# Patient Record
Sex: Female | Born: 2000 | Race: White | Hispanic: No | Marital: Single | Smoking: Former smoker
Health system: Southern US, Community
[De-identification: ages and names within clinical notes are randomized; demographics above are authoritative.]

## PROBLEM LIST (undated history)

## (undated) DIAGNOSIS — R519 Headache, unspecified: Secondary | ICD-10-CM

## (undated) DIAGNOSIS — J45909 Unspecified asthma, uncomplicated: Secondary | ICD-10-CM

## (undated) DIAGNOSIS — F509 Eating disorder, unspecified: Secondary | ICD-10-CM

## (undated) DIAGNOSIS — F32A Depression, unspecified: Secondary | ICD-10-CM

## (undated) DIAGNOSIS — F329 Major depressive disorder, single episode, unspecified: Secondary | ICD-10-CM

## (undated) DIAGNOSIS — K219 Gastro-esophageal reflux disease without esophagitis: Secondary | ICD-10-CM

## (undated) DIAGNOSIS — R51 Headache: Secondary | ICD-10-CM

## (undated) DIAGNOSIS — L309 Dermatitis, unspecified: Secondary | ICD-10-CM

## (undated) DIAGNOSIS — F419 Anxiety disorder, unspecified: Secondary | ICD-10-CM

## (undated) DIAGNOSIS — L709 Acne, unspecified: Secondary | ICD-10-CM

## (undated) HISTORY — DX: Eating disorder, unspecified: F50.9

## (undated) HISTORY — DX: Unspecified asthma, uncomplicated: J45.909

## (undated) HISTORY — PX: SEPTOPLASTY: SUR1290

## (undated) HISTORY — DX: Major depressive disorder, single episode, unspecified: F32.9

## (undated) HISTORY — PX: TONSILLECTOMY: SUR1361

## (undated) HISTORY — DX: Acne, unspecified: L70.9

## (undated) HISTORY — DX: Anxiety disorder, unspecified: F41.9

## (undated) HISTORY — DX: Depression, unspecified: F32.A

---
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
## 2019-01-11 ENCOUNTER — Ambulatory Visit
Admission: EM | Admit: 2019-01-11 | Discharge: 2019-01-11 | Disposition: A | Discharge status: Home / Self Care | Payer: Medicaid Other | Attending: Family Medicine | Admitting: Family Medicine

## 2019-01-11 ENCOUNTER — Encounter: Payer: Self-pay | Admitting: Emergency Medicine

## 2019-01-11 ENCOUNTER — Other Ambulatory Visit: Payer: Self-pay

## 2019-01-11 DIAGNOSIS — Z20828 Contact with and (suspected) exposure to other viral communicable diseases: Secondary | ICD-10-CM | POA: Diagnosis not present

## 2019-01-11 DIAGNOSIS — R197 Diarrhea, unspecified: Secondary | ICD-10-CM

## 2019-01-11 DIAGNOSIS — G44209 Tension-type headache, unspecified, not intractable: Secondary | ICD-10-CM

## 2019-01-11 DIAGNOSIS — Z7189 Other specified counseling: Secondary | ICD-10-CM

## 2019-01-11 DIAGNOSIS — J029 Acute pharyngitis, unspecified: Secondary | ICD-10-CM

## 2019-01-11 DIAGNOSIS — R519 Headache, unspecified: Secondary | ICD-10-CM | POA: Diagnosis present

## 2019-01-11 LAB — RAPID STREP SCREEN (MED CTR MEBANE ONLY): Streptococcus, Group A Screen (Direct): NEGATIVE

## 2019-01-11 MED ORDER — KETOROLAC TROMETHAMINE 60 MG/2ML IM SOLN
60.0000 mg | Freq: Once | INTRAMUSCULAR | Status: AC
  Administered 2019-01-11: 60 mg via INTRAMUSCULAR

## 2019-01-11 NOTE — Discharge Instructions (Signed)
Over-the-counter medication as needed.  Rest. Drink plenty of fluids.  Pending Covid testing.  Follow up with your primary care physician this week as needed. Return to Urgent care for new or worsening concerns.

## 2019-01-11 NOTE — ED Triage Notes (Signed)
Patient in today c/o migraine, diarrhea, sore throat and fatigue x 2 days. Patient has tried Ibuprofen without relief.

## 2019-01-11 NOTE — ED Provider Notes (Signed)
[facility] ____________________________________________  Time seen: Approximately 10:14 AM  I have reviewed the triage vital signs and the nursing notes.   HISTORY  Chief Complaint Migraine, Diarrhea, and Sore Throat   HPI Jessica Hebert is a 18 y.o. female presented for evaluation of 2 days of sore throat, headache and diarrhea.  States headache feels like a migraine and comes and goes.  Describes headache as frontal forehead headache aching went away yesterday and came back today.  No nausea or vomiting.  Has had episodes of diarrhea.  Denies abdominal pain.  Denies known sick contacts.  States sore throat is painful to swallow but has continued to drink fluids well, not eating much today.  Denies known fevers.  Has had some nasal congestion with decreased smell, continues with normal taste.  Has taken Tylenol ibuprofen, denies other alleviating measures.  Denies chest pain, shortness of breath or cough.  Reports otherwise doing well.  Patient's last menstrual period was 12/28/2018 (approximate).Denies pregnancy.    Past Medical History:  Diagnosis Date  . Acne   . Anxiety   . Asthma   . Depression   . Eating disorder, unspecified    Bulimia  . Eczema   . GERD (gastroesophageal reflux disease)   . Headache    sinus related    Patient Active Problem List   Diagnosis Date Noted  . Intractable migraine without aura and without status migrainosus 05/09/2018  . Acne 07/19/2016  . Asthma 07/19/2016  . Anxiety and depression 07/19/2016    Past Surgical History:  Procedure Laterality Date  . ENDOSCOPIC CONCHA BULLOSA RESECTION Left 02/24/2018   Procedure: ENDOSCOPIC CONCHA BULLOSA REDUCTION VIA NASAL ENDOSCOPY;  Surgeon: Margaretha Sheffield, MD;  Location: [place];  Service: ENT;  Laterality: Left;  . RHINOPLASTY  02/24/2018   septoplasty   . SEPTOPLASTY Bilateral 02/24/2018   Procedure: SEPTOPLASTY;  Surgeon: Margaretha Sheffield, MD;  Location: [place];  Service: ENT;  Laterality: Bilateral;  . TONSILLECTOMY AND ADENOIDECTOMY N/A 05/27/2017   Procedure: TONSILLECTOMY AND ADENOIDECTOMY;  Surgeon: Margaretha Sheffield, MD;  Location: [place];  Service: ENT;  Laterality: N/A;  . WISDOM TOOTH EXTRACTION  10/22/2017   four      Current Facility-Administered Medications:  .  ketorolac (TORADOL) injection 60 mg, 60 mg, Intramuscular, Once, Marylene Land, NP  Current Outpatient Medications:  .  albuterol (PROVENTIL HFA;VENTOLIN HFA) 108 (90 Base) MCG/ACT inhaler, Inhale 2 puffs into the lungs every 4 (four) hours as needed for wheezing or shortness of breath., Disp: 1 Inhaler, Rfl: 0 .  DULoxetine (CYMBALTA) 30 MG capsule, Take 3 capsules by mouth daily., Disp: , Rfl:  .  EPINEPHrine 0.3 mg/0.3 mL IJ SOAJ injection, U UTD, Disp: , Rfl:  .  etonogestrel-ethinyl estradiol (NUVARING) 0.12-0.015 MG/24HR vaginal ring, INSERT 1 RING IN THE VAGINA AND LEAVE IN PLACE FOR 3 CONSECUTIVE WEEKS, THEN REMOVE FOR 1 WEEK AS DIRECTED., Disp: 3 each, Rfl: 3 .  LORazepam (ATIVAN) 0.5 MG tablet, Take 1 tablet by mouth 2 (two) times daily., Disp: , Rfl:  .  Melatonin 10 MG TABS, Take by mouth., Disp: , Rfl:  .  montelukast (SINGULAIR) 10 MG tablet, Take 10 mg by mouth at bedtime., Disp: , Rfl:   Allergies Grapeseed extract [nutritional supplements], Other, and Pollen extract  Family History  Problem Relation Age of Onset  . Breast cancer Maternal Grandmother 57  . Hypertension Maternal Grandmother   . Heart disease Maternal Grandmother   . Stroke  Maternal Grandmother 81  . Depression Mother   . Lung cancer Maternal Uncle   . Drug abuse Father     Social History Social History   Tobacco Use  . Smoking status: Never Smoker  . Smokeless tobacco: Never Used  Substance Use Topics  . Alcohol use: Yes    Comment: social  . Drug use: No    Review of Systems Constitutional: No fever Eyes: No visual changes. ENT: Positive sore throat.  Cardiovascular: Denies chest pain. Respiratory: Denies shortness of breath. Gastrointestinal: No abdominal pain.   Genitourinary: Negative for dysuria. Musculoskeletal: Negative for back pain. Skin: Negative for rash. Neurological: Negative for focal weakness or numbness.  Positive headache.    ____________________________________________   PHYSICAL EXAM:  VITAL SIGNS: ED Triage Vitals  Enc Vitals Group     BP 01/11/19 0937 132/87     Pulse Rate 01/11/19 0937 86     Resp 01/11/19 0937 18     Temp 01/11/19 0937 98.2 F (36.8 C)     Temp Source 01/11/19 0937 Oral     SpO2 01/11/19 0937 100 %     Weight 01/11/19 0940 220 lb (99.8 kg)     Height 01/11/19 0940 5' 9.5" (1.765 m)     Head Circumference --      Peak Flow --      Pain Score 01/11/19 0940 10     Pain Loc --      Pain Edu? --      Excl. in GC? --     Constitutional: Alert and oriented. Well appearing and in no acute distress. Eyes: Conjunctivae are normal. PERRL. EOMI. Head: Atraumatic. No sinus tenderness to palpation. No swelling. No erythema.  Ears: no erythema, normal TMs bilaterally.   Nose:Nasal congestion   Mouth/Throat: Mucous membranes are moist. Mild pharyngeal erythema. No tonsillar swelling or exudate.  Neck: No stridor.  No cervical spine tenderness to palpation. Hematological/Lymphatic/Immunilogical: No cervical lymphadenopathy. Cardiovascular: Normal rate, regular rhythm. Grossly normal heart sounds.  Good peripheral circulation. Respiratory: Normal respiratory effort.  No retractions. No wheezes, rales or rhonchi. Good air movement.  Gastrointestinal:  Normal Bowel sounds.  Mild generalized lower abdominal tenderness, no point tenderness.  No guarding.  No CVA tenderness. Musculoskeletal: Ambulatory with steady gait. No cervical, thoracic or lumbar tenderness to palpation. Neurologic:  Normal speech and language. No gait instability.  No meningismus. Skin:  Skin appears warm, dry and intact. No  rash noted. Psychiatric: Mood and affect are normal. Speech and behavior are normal.  ___________________________________________   LABS (all labs ordered are listed, but only abnormal results are displayed)  Labs Reviewed  RAPID STREP SCREEN ([facility] ONLY)  NOVEL CORONAVIRUS, NAA (HOSP ORDER, SEND-OUT TO REF LAB; TAT 18-24 HRS)  CULTURE, GROUP A STREP [facility])    PROCEDURES Procedures    INITIAL IMPRESSION / ASSESSMENT AND PLAN / ED COURSE  Pertinent labs & imaging results that were available during my care of the patient were reviewed by me and considered in my medical decision making (see chart for details).  Well-appearing patient.  No acute distress.  Suspect viral illness.  Strep negative, will culture.  60 mg IM Toradol given once in urgent care.  Encourage rest, fluids, supportive care.  COVID-19 testing completed advice given.  Stay home unless seeking further care pending test results.Discussed indication, risks and benefits of medications with patient.  Discussed follow up with Primary care physician this week. Discussed follow up and return parameters including no  resolution or any worsening concerns. Patient verbalized understanding and agreed to plan.   ____________________________________________   FINAL CLINICAL IMPRESSION(S) / ED DIAGNOSES  Final diagnoses:  Sore throat  Acute nonintractable headache, unspecified headache type  Diarrhea, unspecified type  Advice given about COVID-19 virus infection     ED Discharge Orders    None       Note: This dictation was prepared with Dragon dictation along with smaller phrase technology. Any transcriptional errors that result from this process are unintentional.         Renford DillsMiller, Tannis Burstein, NP 01/11/19 1019

## 2019-01-12 LAB — NOVEL CORONAVIRUS, NAA (HOSP ORDER, SEND-OUT TO REF LAB; TAT 18-24 HRS): SARS-CoV-2, NAA: NOT DETECTED

## 2019-01-13 LAB — CULTURE, GROUP A STREP (THRC)

## 2019-05-16 ENCOUNTER — Ambulatory Visit
Admission: EM | Admit: 2019-05-16 | Discharge: 2019-05-16 | Disposition: A | Discharge status: Home / Self Care | Payer: BC Managed Care – PPO | Attending: Urgent Care | Admitting: Urgent Care

## 2019-05-16 ENCOUNTER — Other Ambulatory Visit: Payer: Self-pay

## 2019-05-16 DIAGNOSIS — Z7189 Other specified counseling: Secondary | ICD-10-CM | POA: Diagnosis present

## 2019-05-16 DIAGNOSIS — J324 Chronic pansinusitis: Secondary | ICD-10-CM | POA: Diagnosis present

## 2019-05-16 DIAGNOSIS — Z20822 Contact with and (suspected) exposure to covid-19: Secondary | ICD-10-CM | POA: Insufficient documentation

## 2019-05-16 LAB — SARS CORONAVIRUS 2 (TAT 6-24 HRS): SARS Coronavirus 2: NEGATIVE

## 2019-05-16 MED ORDER — PREDNISONE 20 MG PO TABS: 40.0000 mg | ORAL_TABLET | Freq: Every day | ORAL | 0 refills | Status: DC

## 2019-05-16 MED ORDER — AMOXICILLIN-POT CLAVULANATE 875-125 MG PO TABS: 1.0000 | ORAL_TABLET | Freq: Two times a day (BID) | ORAL | 0 refills | Status: AC

## 2019-05-16 NOTE — ED Provider Notes (Signed)
[place], [place]   Name: Jessica Hebert DOB: 02/06/2001 MRN: [id number] CSN: [id number] PCP: Sallee Lange, NP  Arrival date and time:  05/16/19 [phone]  Chief Complaint:  Sinus Problem   NOTE: Prior to seeing the patient today, I have reviewed the triage nursing documentation and vital signs. Clinical staff has updated patient's PMH/PSHx, current medication list, and drug allergies/intolerances to ensure comprehensive history available to assist in medical decision making.   History:   HPI: Jessica Hebert is a 19 y.o. female who presents today with complaints of congestion, nasal bleeding, headaches, PND, and both paranasal and frontal sinus tenderness that started approximately 2 weeks ago. Patient denies fevers.. She denies any cough, shortness of breath, or wheezing. Patient has PMH (+) for significant sinus issues. She has had a septoplasty in the past. Patient also with history of asthma and patient feels like she is having a flare. Patient with nausea yesterday. She denies that she has experienced any vomiting, diarrhea, or abdominal pain. She is eating and drinking well. Patient denies any perceived alterations to her sense of taste or smell. Patient was seen by her PCP last week and started on loratadine and montelukast, which she notes have not improved her symptoms. Patient states, "I am getting worse. I just don;t feel well at all". Patient presents out of concerns for her personal health after being exposed to someone several co-workers who have been ill. Patient reporting that 6 people called out of work on Saturday. She is unsure if any of them have tested positive for SARS-CoV-2 (novel coronavirus). She has not been tested for SARS-CoV-2 (novel coronavirus) in the past 14 days; last tested negative several months ago per her report. No one else is her home has experienced a similar symptom constellation. She has never been tested for SARS-CoV-2 (novel coronavirus) in the past per her  report. Patient has not been vaccinated for influenza this season. Despite her symptoms, patient has not taken any over the counter interventions to help improve/relieve her reported symptoms at home.   Past Medical History:  Diagnosis Date  . Acne   . Anxiety   . Asthma   . Depression   . Eating disorder, unspecified    Bulimia  . Eczema   . GERD (gastroesophageal reflux disease)   . Headache    sinus related    Past Surgical History:  Procedure Laterality Date  . ENDOSCOPIC CONCHA BULLOSA RESECTION Left 02/24/2018   Procedure: ENDOSCOPIC CONCHA BULLOSA REDUCTION VIA NASAL ENDOSCOPY;  Surgeon: Margaretha Sheffield, MD;  Location: [facility];  Service: ENT;  Laterality: Left;  . RHINOPLASTY  02/24/2018   septoplasty   . SEPTOPLASTY Bilateral 02/24/2018   Procedure: SEPTOPLASTY;  Surgeon: Margaretha Sheffield, MD;  Location: [facility];  Service: ENT;  Laterality: Bilateral;  . TONSILLECTOMY AND ADENOIDECTOMY N/A 05/27/2017   Procedure: TONSILLECTOMY AND ADENOIDECTOMY;  Surgeon: Margaretha Sheffield, MD;  Location: [facility];  Service: ENT;  Laterality: N/A;  . WISDOM TOOTH EXTRACTION  10/22/2017   four    Family History  Problem Relation Age of Onset  . Breast cancer Maternal Grandmother 54  . Hypertension Maternal Grandmother   . Heart disease Maternal Grandmother   . Stroke Maternal Grandmother 27  . Depression Mother   . Lung cancer Maternal Uncle   . Drug abuse Father     Social History   Tobacco Use  . Smoking status: Never Smoker  . Smokeless tobacco: Never Used  Substance Use Topics  .  Alcohol use: Yes    Comment: social  . Drug use: No    Patient Active Problem List   Diagnosis Date Noted  . Intractable migraine without aura and without status migrainosus 05/09/2018  . Acne 07/19/2016  . Asthma 07/19/2016  . Anxiety and depression 07/19/2016    Home Medications:    Current Meds  Medication Sig  . albuterol (PROVENTIL HFA;VENTOLIN HFA) 108  (90 Base) MCG/ACT inhaler Inhale 2 puffs into the lungs every 4 (four) hours as needed for wheezing or shortness of breath.  Marland Kitchen amitriptyline (ELAVIL) 10 MG tablet Take 1 tablet by mouth at bedtime.  . DULoxetine (CYMBALTA) 30 MG capsule Take 3 capsules by mouth daily.  Marland Kitchen EPINEPHrine 0.3 mg/0.3 mL IJ SOAJ injection U UTD  . etonogestrel-ethinyl estradiol (NUVARING) 0.12-0.015 MG/24HR vaginal ring INSERT 1 RING IN THE VAGINA AND LEAVE IN PLACE FOR 3 CONSECUTIVE WEEKS, THEN REMOVE FOR 1 WEEK AS DIRECTED.  Marland Kitchen loratadine (CLARITIN) 10 MG tablet Take 10 mg by mouth daily.  Marland Kitchen LORazepam (ATIVAN) 0.5 MG tablet Take 1 tablet by mouth 2 (two) times daily.  . Melatonin 10 MG TABS Take by mouth.  . montelukast (SINGULAIR) 10 MG tablet Take 10 mg by mouth at bedtime.    Allergies:   Grapeseed extract [nutritional supplements], Other, and Pollen extract  Review of Systems (ROS):  Review of systems NEGATIVE unless otherwise noted in narrative H&P section.   Vital Signs: Today's Vitals   05/16/19 1009 05/16/19 1012 05/16/19 1039  BP:  (!) 146/115   Pulse:  91   Resp:  18   Temp:  97.9 F (36.6 C)   TempSrc:  Oral   SpO2:  100%   Weight: 220 lb (99.8 kg)    Height: 5' 9.5" (1.765 m)    PainSc: 0-No pain  0-No pain    Physical Exam: Physical Exam  Constitutional: She is oriented to person, place, and time and well-developed, well-nourished, and in no distress.  HENT:  Head: Normocephalic and atraumatic.  Right Ear: Tympanic membrane normal.  Left Ear: There is tenderness. Tympanic membrane is erythematous. Tympanic membrane is not bulging. A middle ear effusion (mild suppurative) is present.  Nose: Mucosal edema, rhinorrhea and sinus tenderness present.  Mouth/Throat: Uvula is midline. Posterior oropharyngeal erythema (moderate with (+) purulent PND) present. No oropharyngeal exudate or posterior oropharyngeal edema.  Eyes: Pupils are equal, round, and reactive to light.  Cardiovascular:  Normal rate, regular rhythm, normal heart sounds and intact distal pulses.  Pulmonary/Chest: Effort normal. She has wheezes (scattered expiratory).  No cough noted in clinic. No SOB or increased WOB. No distress. Able to speak in complete sentences without difficulties. SPO2 100% on RA.  Lymphadenopathy:       Head (left side): Submandibular adenopathy present.  Neurological: She is alert and oriented to person, place, and time. Gait normal.  Skin: Skin is warm and dry. No rash noted. She is not diaphoretic.  Psychiatric: Mood, memory, affect and judgment normal.  Nursing note and vitals reviewed.   Urgent Care Treatments / Results:   Orders Placed This Encounter  Procedures  . SARS CORONAVIRUS 2 (TAT 6-24 HRS) Nasopharyngeal Nasopharyngeal Swab    LABS: PLEASE NOTE: all labs that were ordered this encounter are listed, however only abnormal results are displayed. Labs Reviewed  SARS CORONAVIRUS 2 (TAT 6-24 HRS)    EKG: -None  RADIOLOGY: No results found.  PROCEDURES: Procedures  MEDICATIONS RECEIVED THIS VISIT: Medications - No data to display  PERTINENT CLINICAL COURSE NOTES/UPDATES:   Initial Impression / Assessment and Plan / Urgent Care Course:  Pertinent labs & imaging results that were available during my care of the patient were personally reviewed by me and considered in my medical decision making (see lab/imaging section of note for values and interpretations).  KAMILYA WAKEMAN is a 19 y.o. female who presents to [facility] today with complaints of Sinus Problem  Patient overall well appearing and in no acute distress today in clinic. Presenting symptoms (see HPI) and exam as documented above. She presents with symptoms associated with SARS-CoV-2 (novel coronavirus) following close exposure to several co-workers who are ill with similar symptoms.  Discussed typical symptom constellation. Reviewed potential for infection and need for testing. Patient  amenable to being tested. SARS-CoV-2 swab collected by certified clinical staff. Discussed variable turn around times associated with testing, as swabs are being processed at the main campus of [facility] in [place], and have been taking 12-24 hours to come back. She was advised to self quarantine, per [facility] DHHS guidelines, until negative results received. These measures are being implemented out of an abundance of caution to prevent transmission and spread during the current SARS-CoV-2 pandemic.  Presenting symptoms consistent with acute sinusitis and flare of patient's known asthma. She has been treated for allergies and continues to worsen. Will proceed with treatment using a 10 day course of amoxicillin-clavulanate,and a 5 day steroid (prednisone) burst. Patient to continue SABA (albuterol) MDI. Discussed that, until ruled out with confirmatory lab testing, SARS-CoV-2 remains part of the differential. Her testing is pending at this time. Discussed supportive care measures at home during acute phase of illness. Patient to rest as much as possible. She was encouraged to ensure adequate hydration (water and ORS) to prevent dehydration and electrolyte derangements. Recommended warm salt water gargles, hard candies/lozenges, and hot tea with honey/lemon to help soothe the throat and reduce irritation. May use Tylenol and/or Ibuprofen as needed for pain/fever.    Current clinical condition warrants patient being out of work in order to quarantine while waiting for testing results. She was provided with the appropriate documentation to provide to her place of employment that will allow for her to RTW on 05/19/2019 with no restrictions. RTW is contingent on her SARS-CoV-2 test results being reviewed as negative.     Discussed follow up with primary care physician in 1 week for re-evaluation. I have reviewed the follow up and strict return precautions for any new or worsening symptoms. Patient is aware of  symptoms that would be deemed urgent/emergent, and would thus require further evaluation either here or in the emergency department. At the time of discharge, she verbalized understanding and consent with the discharge plan as it was reviewed with her. All questions were fielded by provider and/or clinic staff prior to patient discharge.    Final Clinical Impressions / Urgent Care Diagnoses:   Final diagnoses:  Pansinusitis, unspecified chronicity  Encounter for laboratory testing for COVID-19 virus  Advice given about COVID-19 virus infection    New Prescriptions:  [place] Controlled Substance Registry consulted? Not Applicable  Meds ordered this encounter  Medications  . amoxicillin-clavulanate (AUGMENTIN) 875-125 MG tablet    Sig: Take 1 tablet by mouth 2 (two) times daily for 10 days.    Dispense:  20 tablet    Refill:  0  . predniSONE (DELTASONE) 20 MG tablet    Sig: Take 2 tablets (40 mg total) by mouth daily.    Dispense:  10 tablet    Refill:  0    Recommended Follow up Care:  Patient encouraged to follow up with the following provider within the specified time frame, or sooner as dictated by the severity of her symptoms. As always, she was instructed that for any urgent/emergent care needs, she should seek care either here or in the emergency department for more immediate evaluation.  Follow-up Information    Gauger, Hermenia Fiscal, NP In 1 week.   Specialty: Internal Medicine Why: General reassessment of symptoms if not improving Contact information: [address] [place] [address] [phone]         NOTE: This note was prepared using Dragon dictation software along with smaller phrase technology. Despite my best ability to proofread, there is the potential that transcriptional errors may still occur from this process, and are completely unintentional.    Verlee Monte, NP 05/16/19 1153

## 2019-05-16 NOTE — Discharge Instructions (Signed)
It was very nice seeing you today in clinic. Thank you for entrusting me with your care.   Rest and Stay HYDRATED. Water and electrolyte containing beverages (Gatorade, Pedialyte) are best to prevent dehydration and electrolyte abnormalities. Please utilize the medications that we discussed. Your prescriptions has been called in to your pharmacy. May use Tylenol and/or Ibuprofen as needed for pain/fever.   You were tested for SARS-CoV-2 (novel coronavirus) today. Testing is being processed at the main campus of [facility] in [place], and have been taking 12-24 hours to come back. Current recommendations from the the CDC and [place] DHHS require that you remain out of work in order to quarantine at home until negative test results are have been received. In the event that your test results are positive, you will be contacted with further directives. These measures are being implemented out of an abundance of caution to prevent transmission and spread during the current SARS-CoV-2 pandemic.   Make arrangements to follow up with your regular doctor in 1 week for re-evaluation if not improving. If your symptoms/condition worsens, please seek follow up care either here or in the ER. Please remember, our [facility] providers are "right here with you" when you need us.   Again, it was my pleasure to take care of you today. Thank you for choosing our clinic. I hope that you start to feel better quickly.   Babe Clenney, MSN, APRN, FNP-C, CEN Advanced Practice Provider  [facility] 

## 2019-05-16 NOTE — ED Triage Notes (Signed)
Pt presents with c/o sinus congestion and nose bleeds with pnd for several weeks now. Pt also reports her asthma has been flaring due to this. Pt has not taken any OTC medications for symptoms. Pt denies fever/chills, sore throat, ear pain or other symptoms. Pt states her employer is requesting COVID testing.

## 2019-05-25 IMAGING — CR DG HAND COMPLETE 3+V*L*
3 series · 3 of 3 positions shown · non-contrast
Comparison: None.

CLINICAL DATA: Per patient left hand got slammed in her car door x
today. Pt unable to straighten out hand completely for x-ray. Pt
states pinky and half her hand got slammed in door by wind.

EXAM:
LEFT HAND - COMPLETE 3+ VIEW

[hand ap]
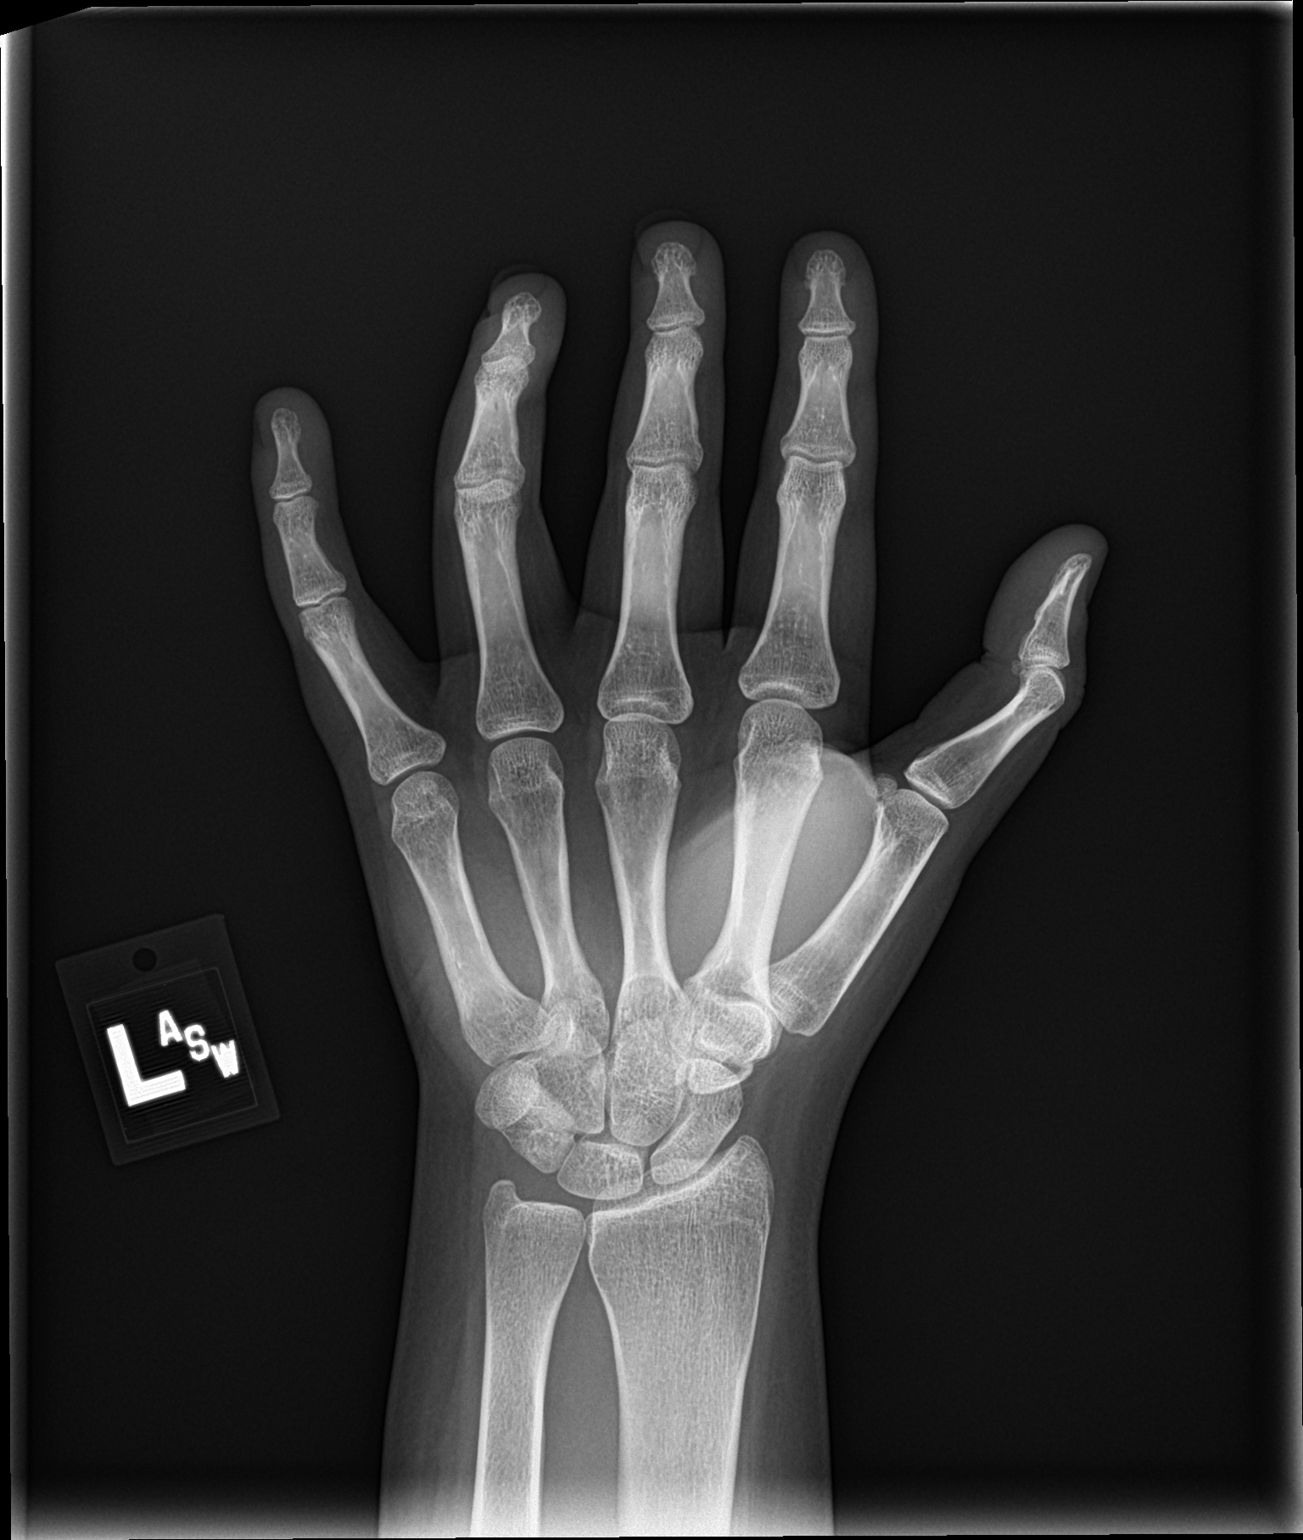

[hand obl]
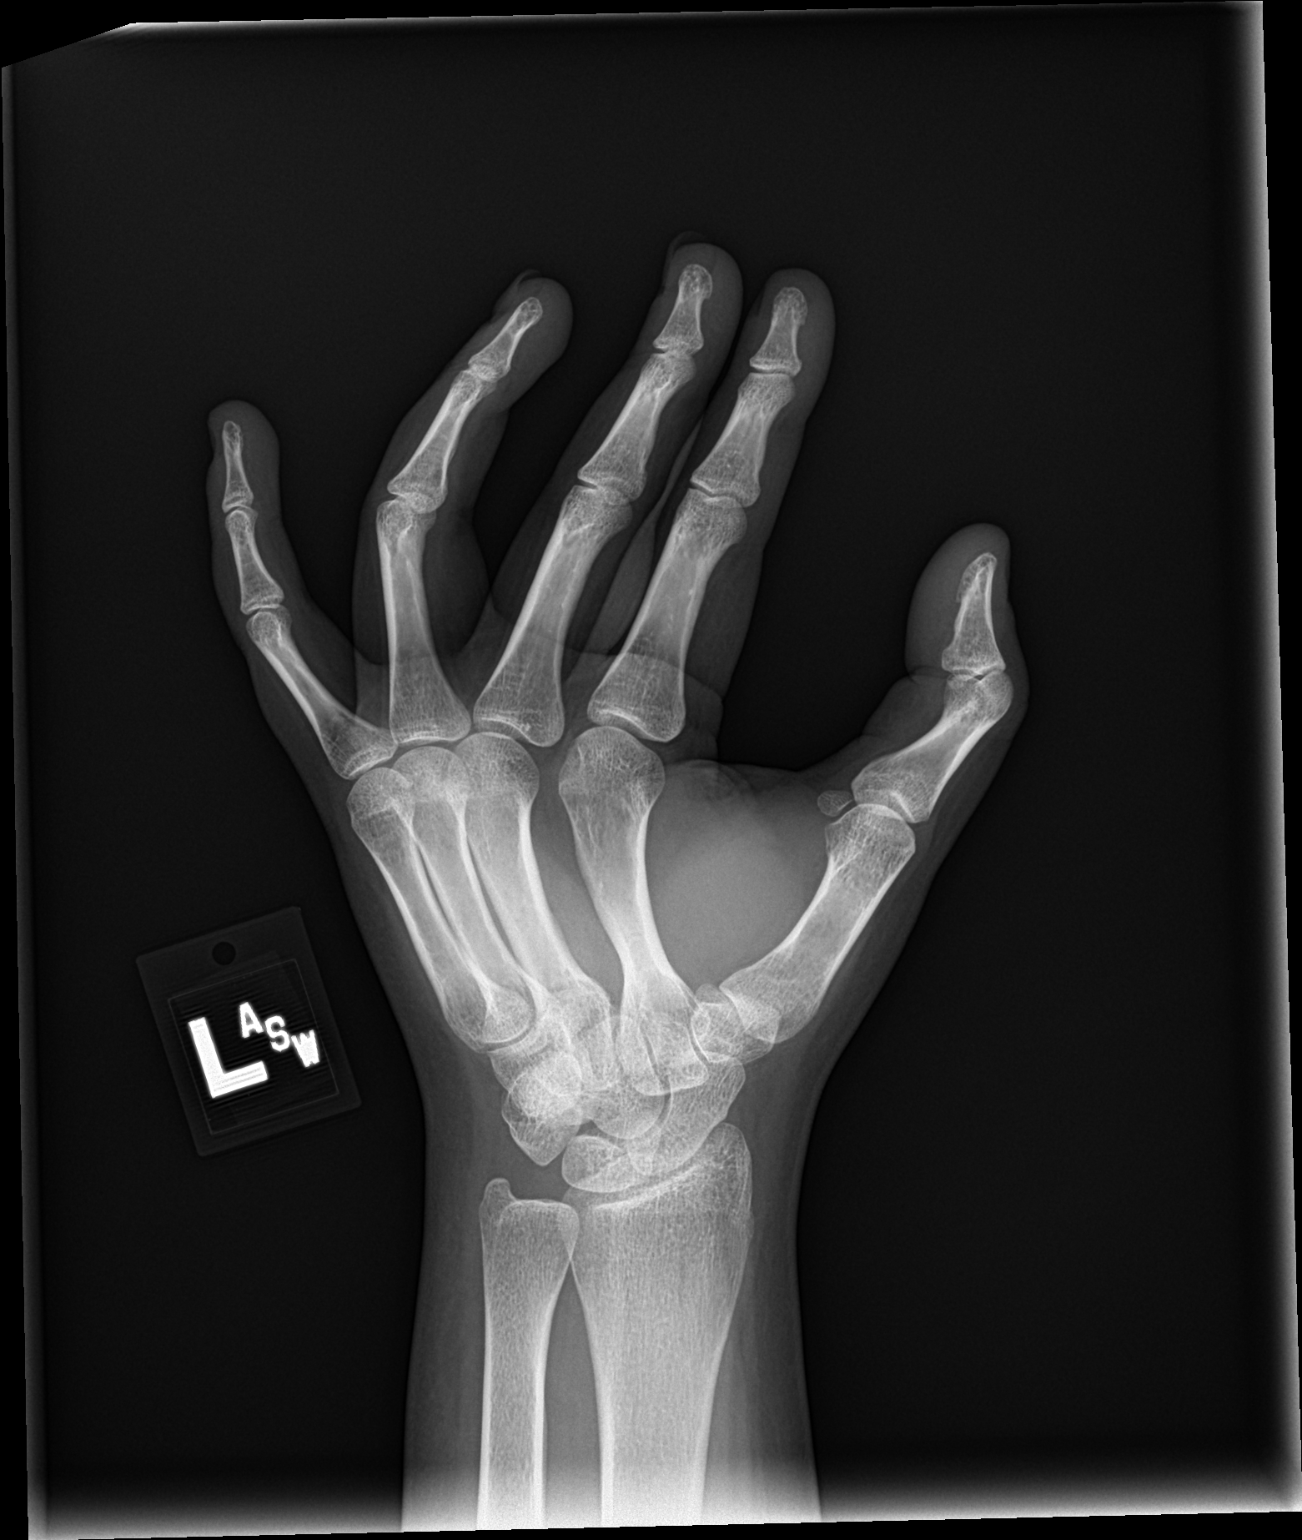

[hand lat]
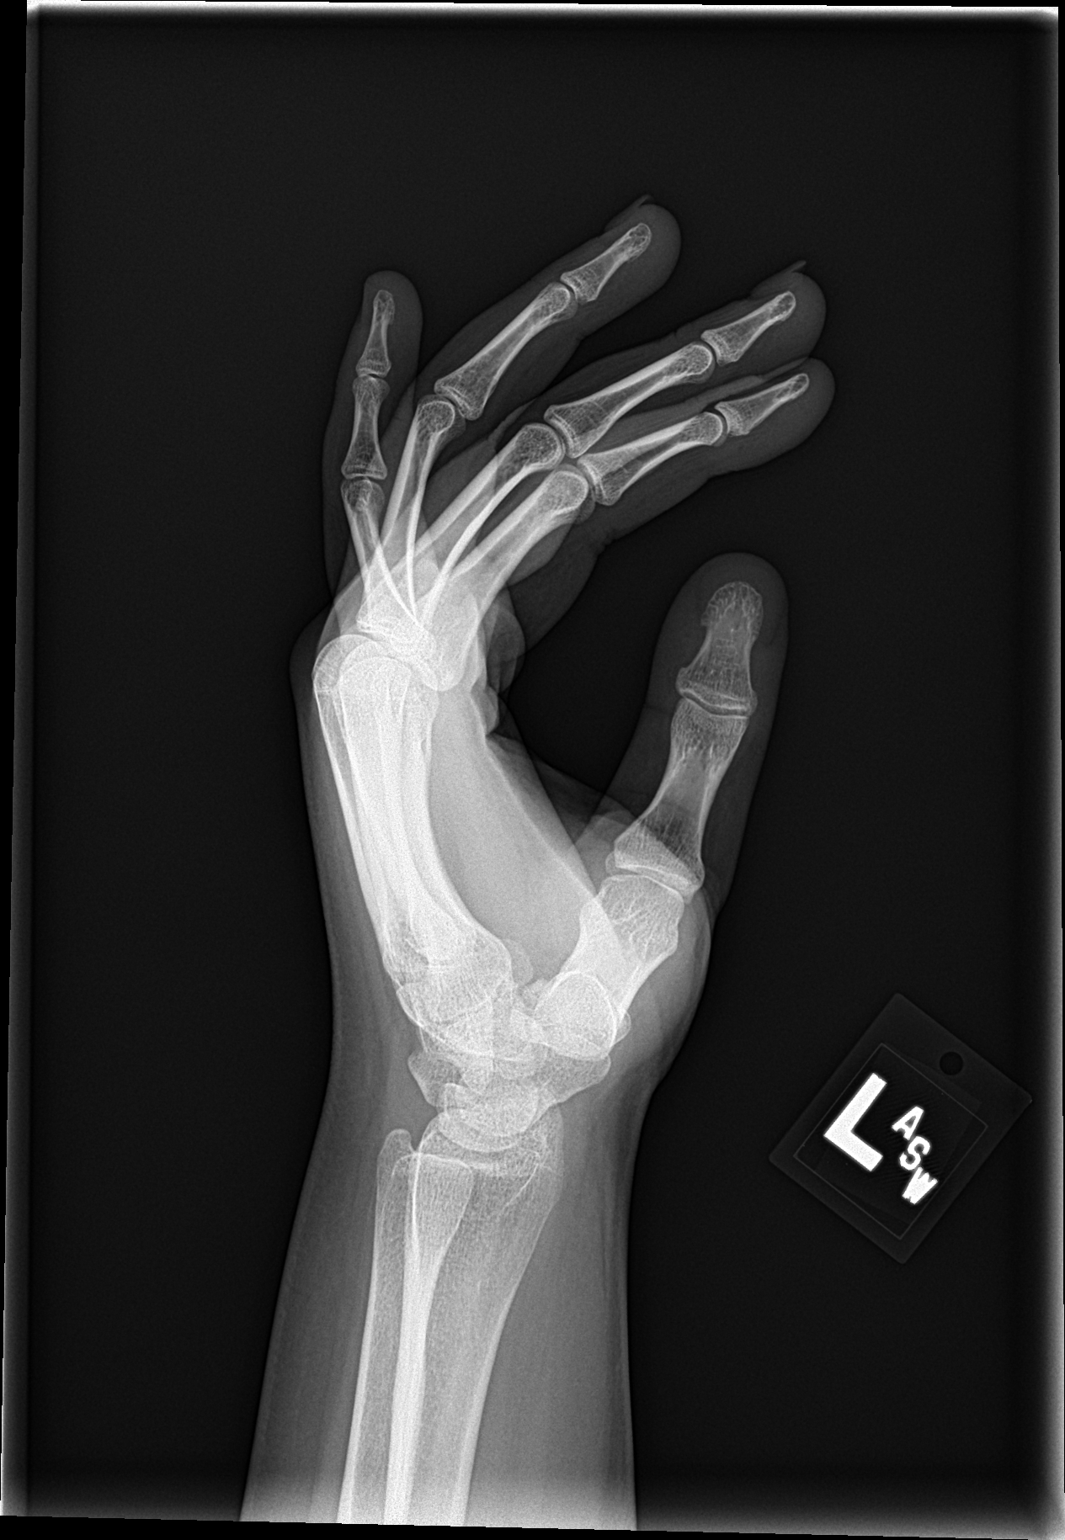

[3 of 3 positions shown; findings below may reference images not displayed]

FINDINGS: There is no evidence of fracture or dislocation. There is no
evidence of arthropathy or other focal bone abnormality. Soft
tissues are unremarkable.
IMPRESSION: Negative.

## 2019-07-24 ENCOUNTER — Other Ambulatory Visit: Payer: Self-pay

## 2019-07-24 ENCOUNTER — Ambulatory Visit: Payer: Self-pay

## 2019-07-24 DIAGNOSIS — E663 Overweight: Secondary | ICD-10-CM | POA: Insufficient documentation

## 2019-07-24 DIAGNOSIS — F909 Attention-deficit hyperactivity disorder, unspecified type: Secondary | ICD-10-CM | POA: Insufficient documentation

## 2019-07-24 DIAGNOSIS — J309 Allergic rhinitis, unspecified: Secondary | ICD-10-CM | POA: Insufficient documentation

## 2019-07-24 DIAGNOSIS — Z0283 Encounter for blood-alcohol and blood-drug test: Secondary | ICD-10-CM

## 2019-07-24 NOTE — Progress Notes (Signed)
Presents for pre-employment drug screen. Specimen collected using [employer] of Custody form for [facility] account number [id number]. Specimen ID [id number]  AMD

## 2020-03-16 ENCOUNTER — Ambulatory Visit: Admit: 2020-03-16 | Discharge: 2020-03-16 | Discharge status: Against Medical Advice | Payer: Medicaid Other

## 2020-03-16 ENCOUNTER — Other Ambulatory Visit: Payer: Self-pay

## 2020-04-30 ENCOUNTER — Other Ambulatory Visit: Payer: Self-pay

## 2020-04-30 ENCOUNTER — Ambulatory Visit
Admission: EM | Admit: 2020-04-30 | Discharge: 2020-04-30 | Disposition: A | Discharge status: Home / Self Care | Payer: Medicaid Other | Attending: Emergency Medicine | Admitting: Emergency Medicine

## 2020-04-30 DIAGNOSIS — G43009 Migraine without aura, not intractable, without status migrainosus: Secondary | ICD-10-CM

## 2020-04-30 DIAGNOSIS — J324 Chronic pansinusitis: Secondary | ICD-10-CM

## 2020-04-30 MED ORDER — FLUTICASONE PROPIONATE 50 MCG/ACT NA SUSP: 2.0000 | Freq: Every day | NASAL | 0 refills | Status: DC

## 2020-04-30 MED ORDER — ONDANSETRON 8 MG PO TBDP
8.0000 mg | ORAL_TABLET | Freq: Once | ORAL | Status: AC
  Administered 2020-04-30: 8 mg via ORAL

## 2020-04-30 MED ORDER — KETOROLAC TROMETHAMINE 60 MG/2ML IM SOLN
30.0000 mg | Freq: Once | INTRAMUSCULAR | Status: AC
  Administered 2020-04-30: 30 mg via INTRAMUSCULAR

## 2020-04-30 MED ORDER — ACETAMINOPHEN 500 MG PO TABS
1000.0000 mg | ORAL_TABLET | Freq: Once | ORAL | Status: AC
  Administered 2020-04-30: 1000 mg via ORAL

## 2020-04-30 MED ORDER — IBUPROFEN 600 MG PO TABS: 600.0000 mg | ORAL_TABLET | Freq: Four times a day (QID) | ORAL | 0 refills | Status: DC | PRN

## 2020-04-30 MED ORDER — AMOXICILLIN-POT CLAVULANATE 875-125 MG PO TABS: 1.0000 | ORAL_TABLET | Freq: Two times a day (BID) | ORAL | 0 refills | Status: AC

## 2020-04-30 MED ORDER — DEXAMETHASONE SODIUM PHOSPHATE 10 MG/ML IJ SOLN
10.0000 mg | Freq: Once | INTRAMUSCULAR | Status: AC
  Administered 2020-04-30: 10 mg via INTRAMUSCULAR

## 2020-04-30 NOTE — ED Provider Notes (Signed)
HPI  SUBJECTIVE:  Jessica Hebert is a 20 y.o. female who reports a acute onset of frontal headache located above her eyes starting this morning. She reports nausea, one episode of emesis. Reports green rhinorrhea, nasal congestion, sinus pain and pressure worse on the right than the left. She has had similar headaches every other day over the past 2 months, but states that today is different in that it is more severe and is accompanied by nausea, vomiting. She reports phonophobia, photophobia. It has not changed since it started. It was not worst at its onset. No syncope, it did not occur with exertion. No fevers, blurry vision, double vision, visual loss, ear pain, jaw pain, dental pain, ndysarthria, focal weakness, facial droop, discoordination. No body aches, neck stiffness, rash. No mental status changes, seizures, syncope. Pt is not pregnant.  Past medical history of frequent sinusitis, status post septoplasty and tonsillectomy, COVID, hypertension. Negative for aneurysm, SAH/ICH, HIV,   stroke, atrial fibrillation or temporal arteritis. Pt is not on any antiplatelets/anticoagulants. LMP: 1 month ago. Denies possibility being pregnant. PMD: [facility]   Past Medical History:  Diagnosis Date  . Acne   . Anxiety   . Asthma   . Depression   . Eating disorder, unspecified    Bulimia  . Eczema   . GERD (gastroesophageal reflux disease)   . Headache    sinus related    Past Surgical History:  Procedure Laterality Date  . ENDOSCOPIC CONCHA BULLOSA RESECTION Left 02/24/2018   Procedure: ENDOSCOPIC CONCHA BULLOSA REDUCTION VIA NASAL ENDOSCOPY;  Surgeon: Vernie Murders, MD;  [facility];  Service: ENT;  Laterality: Left;  . RHINOPLASTY  02/24/2018   septoplasty   . SEPTOPLASTY Bilateral 02/24/2018   Procedure: SEPTOPLASTY;  Surgeon: Vernie Murders, MD;  Location: [facility];  Service: ENT;  Laterality: Bilateral;  . TONSILLECTOMY AND ADENOIDECTOMY N/A  05/27/2017   Procedure: TONSILLECTOMY AND ADENOIDECTOMY;  Surgeon: Vernie Murders, MD;  Location: [facility];  Service: ENT;  Laterality: N/A;  . WISDOM TOOTH EXTRACTION  10/22/2017   four    Family History  Problem Relation Age of Onset  . Breast cancer Maternal Grandmother 58  . Hypertension Maternal Grandmother   . Heart disease Maternal Grandmother   . Stroke Maternal Grandmother 66  . Depression Mother   . Lung cancer Maternal Uncle   . Drug abuse Father     Social History   Tobacco Use  . Smoking status: Never Smoker  . Smokeless tobacco: Never Used  Vaping Use  . Vaping Use: Never used  Substance Use Topics  . Alcohol use: Yes    Comment: social  . Drug use: No    No current facility-administered medications for this encounter.  Current Outpatient Medications:  .  albuterol (PROVENTIL HFA;VENTOLIN HFA) 108 (90 Base) MCG/ACT inhaler, Inhale 2 puffs into the lungs every 4 (four) hours as needed for wheezing or shortness of breath., Disp: 1 Inhaler, Rfl: 0 .  amoxicillin-clavulanate (AUGMENTIN) 875-125 MG tablet, Take 1 tablet by mouth 2 (two) times daily for 10 days., Disp: 20 tablet, Rfl: 0 .  EPINEPHrine 0.3 mg/0.3 mL IJ SOAJ injection, U UTD, Disp: , Rfl:  .  fluticasone (FLONASE) 50 MCG/ACT nasal spray, Place 2 sprays into both nostrils daily., Disp: 16 g, Rfl: 0 .  ibuprofen (ADVIL) 600 MG tablet, Take 1 tablet (600 mg total) by mouth every 6 (six) hours as needed., Disp: 30 tablet, Rfl: 0 .  loratadine (CLARITIN)  10 MG tablet, Take 10 mg by mouth daily., Disp: , Rfl:  .  Melatonin 10 MG TABS, Take by mouth., Disp: , Rfl:  .  montelukast (SINGULAIR) 10 MG tablet, Take 10 mg by mouth at bedtime., Disp: , Rfl:  .  amitriptyline (ELAVIL) 10 MG tablet, Take 1 tablet by mouth at bedtime., Disp: , Rfl:  .  DULoxetine (CYMBALTA) 30 MG capsule, Take 3 capsules by mouth daily., Disp: , Rfl:  .  etonogestrel-ethinyl estradiol (NUVARING) 0.12-0.015 MG/24HR vaginal  ring, INSERT 1 RING IN THE VAGINA AND LEAVE IN PLACE FOR 3 CONSECUTIVE WEEKS, THEN REMOVE FOR 1 WEEK AS DIRECTED., Disp: 3 each, Rfl: 3 .  levonorgestrel-ethinyl estradiol (SEASONALE) 0.15-0.03 MG tablet, Take 1 tablet by mouth daily., Disp: , Rfl:  .  LORazepam (ATIVAN) 0.5 MG tablet, Take 1 tablet by mouth 2 (two) times daily., Disp: , Rfl:  .  venlafaxine XR (EFFEXOR-XR) 75 MG 24 hr capsule, Take 225 mg by mouth daily., Disp: , Rfl:   Allergies  Allergen Reactions  . Grapeseed Extract [Nutritional Supplements] Shortness Of Breath    Grapes cause wheezing  . Other     Cats- swelling eyes; sneezing  . Pollen Extract Other (See Comments)    Hickory tree - sneezing     ROS  As noted in HPI.   Physical Exam  BP (!) 145/86 (BP Location: Right Arm)   Pulse 92   Temp 98.2 F (36.8 C) (Oral)   Resp 18   Ht 5' 9.5" (1.765 m)   Wt 104.3 kg   SpO2 100%   BMI 33.48 kg/m   Constitutional: Well developed, well nourished, no acute distress Eyes: PERRL, EOMI, conjunctiva normal bilaterally.  No obvious photophobia. HENT: Normocephalic, atraumatic,mucus membranes moist.  TM normal b/l. No TMJ tenderness.  Positive purulent bilateral nasal congestion, positive right-sided frontal and maxillary sinus tenderness.  Positive left frontal sinus tenderness.  No temporal artery tenderness.  Neck: no cervical LN + bilateral trapezial muscle tenderness. No meningismus Respiratory: normal inspiratory effort Cardiovascular: Normal rate, regular rhythm GI:  nondistended skin: No rash, skin intact Musculoskeletal: No edema, no tenderness, no deformities Neurologic: Alert & oriented x 3, CN III-XII intact, romberg neg, finger-> nose, heel-> shin equal b/l, Romberg neg, tandem gait steady Psychiatric: Speech and behavior appropriate   ED Course  Medications  dexamethasone (DECADRON) injection 10 mg (10 mg Intramuscular Given 04/30/20 1718)  ketorolac (TORADOL) injection 30 mg (30 mg Intramuscular  Given 04/30/20 1717)  ondansetron (ZOFRAN-ODT) disintegrating tablet 8 mg (8 mg Oral Given 04/30/20 1716)  acetaminophen (TYLENOL) tablet 1,000 mg (1,000 mg Oral Given 04/30/20 1716)    No orders of the defined types were placed in this encounter.  No results found for this or any previous visit (from the past 24 hour(s)). No results found.   ED Clinical Impression  1. Migraine without aura and without status migrainosus, not intractable   2. Pansinusitis, unspecified chronicity     ED Assessment/Plan  Patient meets [facility] criteria (age less than 40, no neck pain or stiffness, no witnessed LOC, no onset during exertion,  no maximum severity within 1 second and no resistance to neck flexion on exam).  Doubt SAH.  While this was acute in onset, she has been having headaches like this for over 2 months.  It today's nausea, vomiting and the fact that it is not responding to usual measures such as Tylenol that brings her in. Doubt SAH, ICH or space occupying lesion.  Pt without fevers/chills, Pt has no meningeal sx, no nuchal rigidity. Doubt meningitis. Pt with normal neuro exam, no evidence of CVA/TIA.  Pt BP not elevated significantly, doubt hypertensive emergency. No evidence of temporal artery tenderness, no evidence of glaucoma or other ocular pathology. Will give headache cocktail (dexamethasone 10 IM  zofran 8 po, toradol 30 IM ),  and reassess.  Suspect chronic sinusitis causing a migraine with the purulent nasal congestion and sinus tenderness.  Considered CT today given duration of headaches, however, will try and treat as a sinusitis and if not better after finishing treatment, she is to follow-up with PMD or may return here for head CT.   Pt much improved after medications. Pt with continued non-focal neuro exam. Home with Flonase, Augmentin for 10 days for the sinusitis, saline nasal irrigation, Tylenol/ibuprofen, Zofran.  Discussed labs, imaging, MDM, plan for follow up, signs and  sx that should prompt return to ER. Pt agrees with plan  Meds ordered this encounter  Medications  . dexamethasone (DECADRON) injection 10 mg  . ketorolac (TORADOL) injection 30 mg  . ondansetron (ZOFRAN-ODT) disintegrating tablet 8 mg  . acetaminophen (TYLENOL) tablet 1,000 mg  . amoxicillin-clavulanate (AUGMENTIN) 875-125 MG tablet    Sig: Take 1 tablet by mouth 2 (two) times daily for 10 days.    Dispense:  20 tablet    Refill:  0  . ibuprofen (ADVIL) 600 MG tablet    Sig: Take 1 tablet (600 mg total) by mouth every 6 (six) hours as needed.    Dispense:  30 tablet    Refill:  0  . fluticasone (FLONASE) 50 MCG/ACT nasal spray    Sig: Place 2 sprays into both nostrils daily.    Dispense:  16 g    Refill:  0    *This clinic note was created using Scientist, clinical (histocompatibility and immunogenetics). Therefore, there may be occasional mistakes despite careful proofreading.  ?    Domenick Gong, MD 04/30/20 [phone]

## 2020-04-30 NOTE — Discharge Instructions (Addendum)
I suspect he have a sinus infection causing a migraine.  Finish the Augmentin, if you feel better.  Take 600 mg of ibuprofen combined with 1000 mg of Tylenol together 3-4 times a day as needed for pain.  Zofran as needed for nausea and vomiting.  Push electrolyte containing fluids such as Pedialyte or Gatorade.  Saline nasal irrigation with a [employer] sinus rinse and distilled water as often as you want.  Start the [employer] as well.  Follow-up here or with your doctor if not better after finishing the antibiotics, go immediately to the ER for the signs and symptoms we discussed.

## 2020-04-30 NOTE — ED Triage Notes (Signed)
Patient states that she has been having a headache since this morning. States that she has them every other day for 2 months. States that she has been nausea with this one.

## 2020-05-21 IMAGING — CR DG ABDOMEN 2V
4 series · 4 of 4 positions shown · non-contrast
Comparison: None.

CLINICAL DATA: Abdomen pain

EXAM:
ABDOMEN - 2 VIEW

[abdomen erect (1 of 2)]
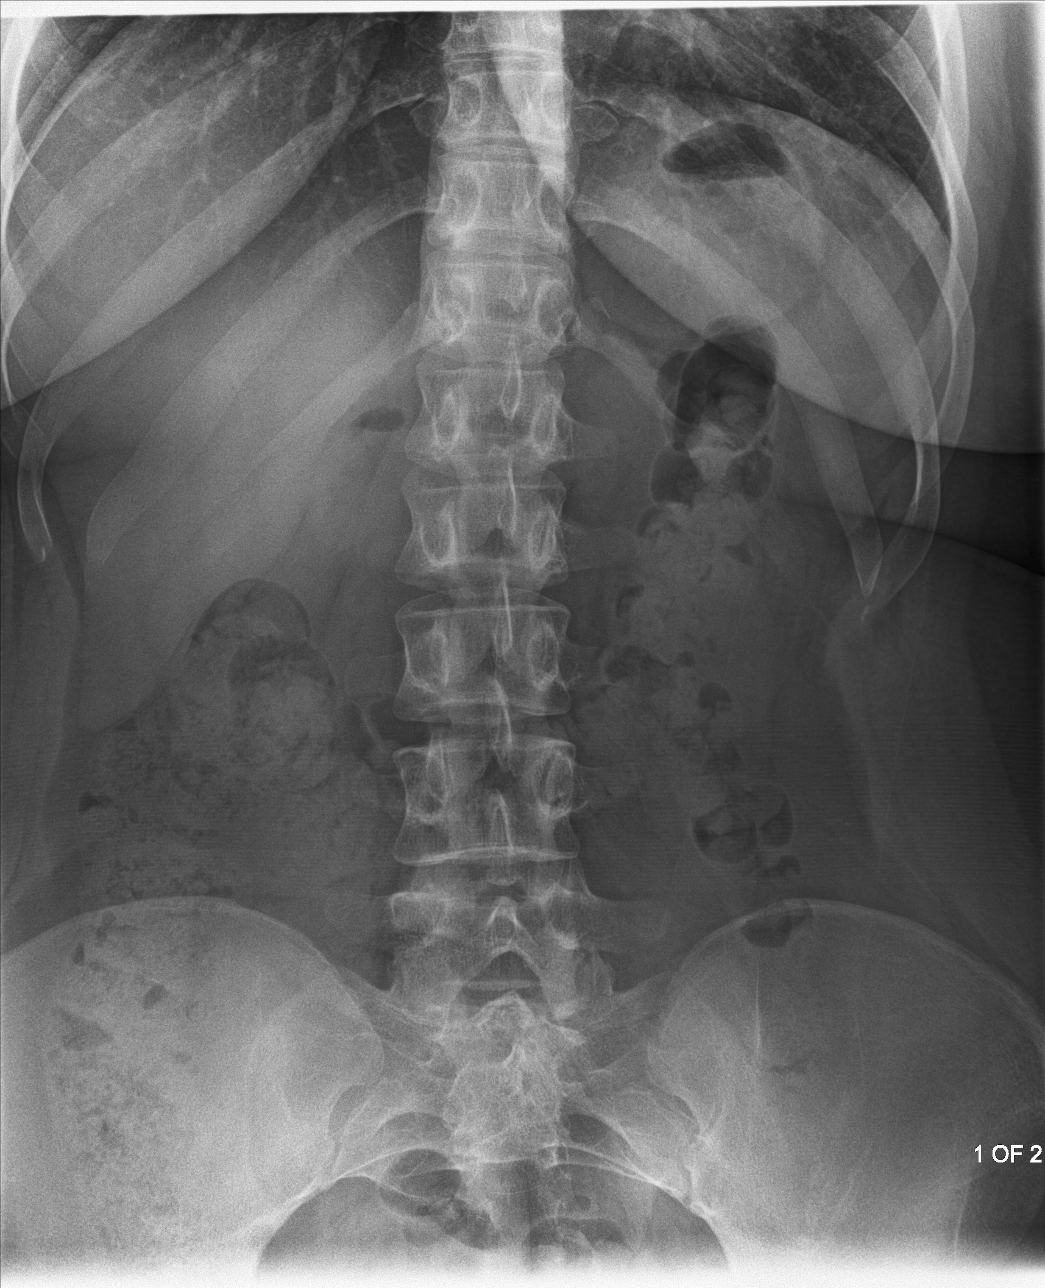

[abdomen erect (2 of 2)]
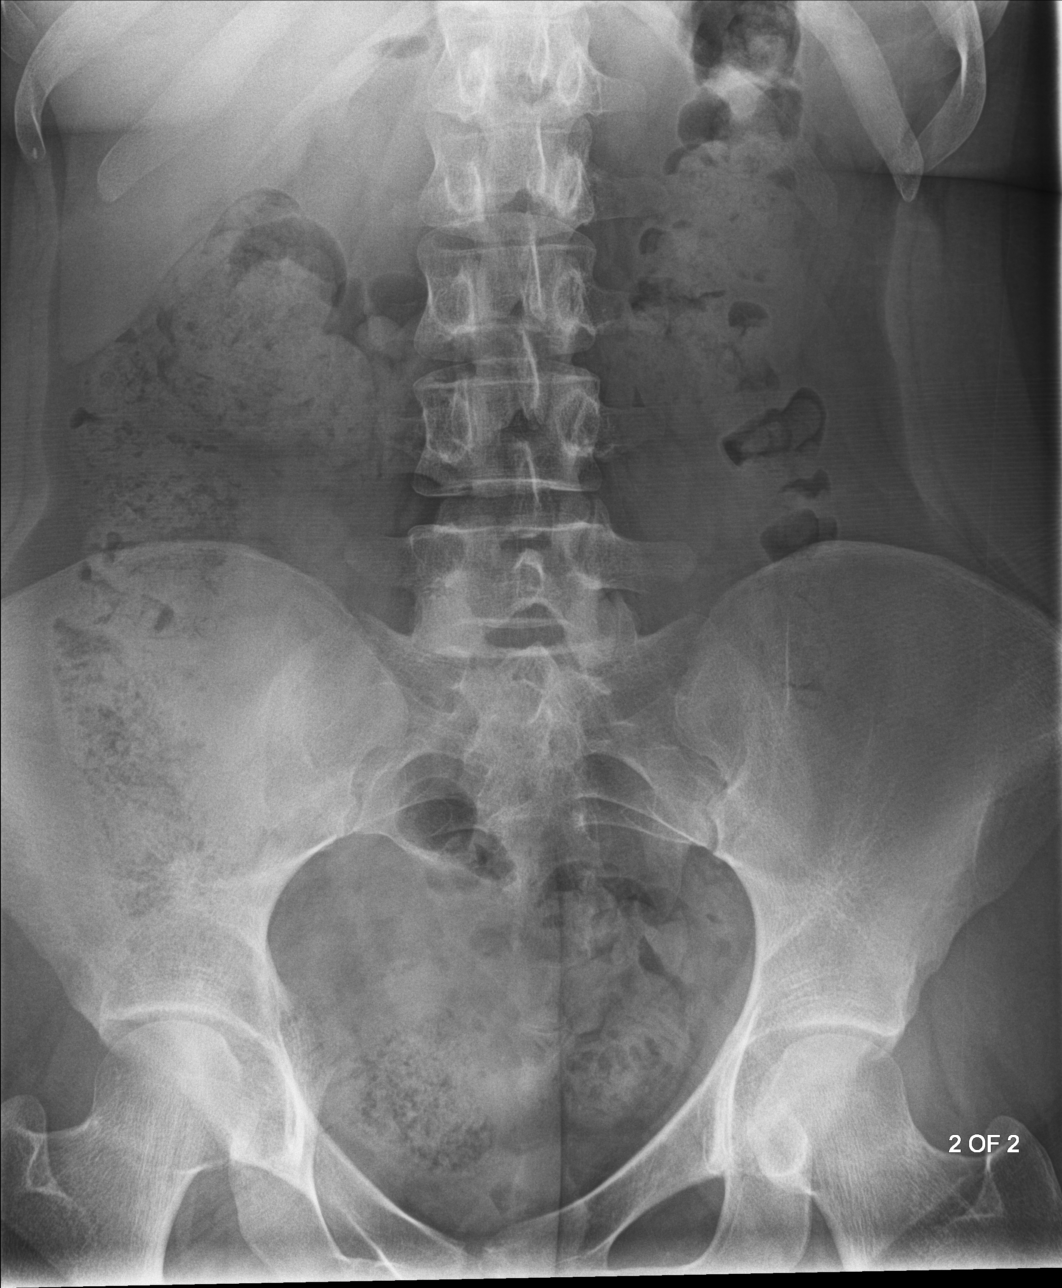

[abdomen supine (1 of 2)]
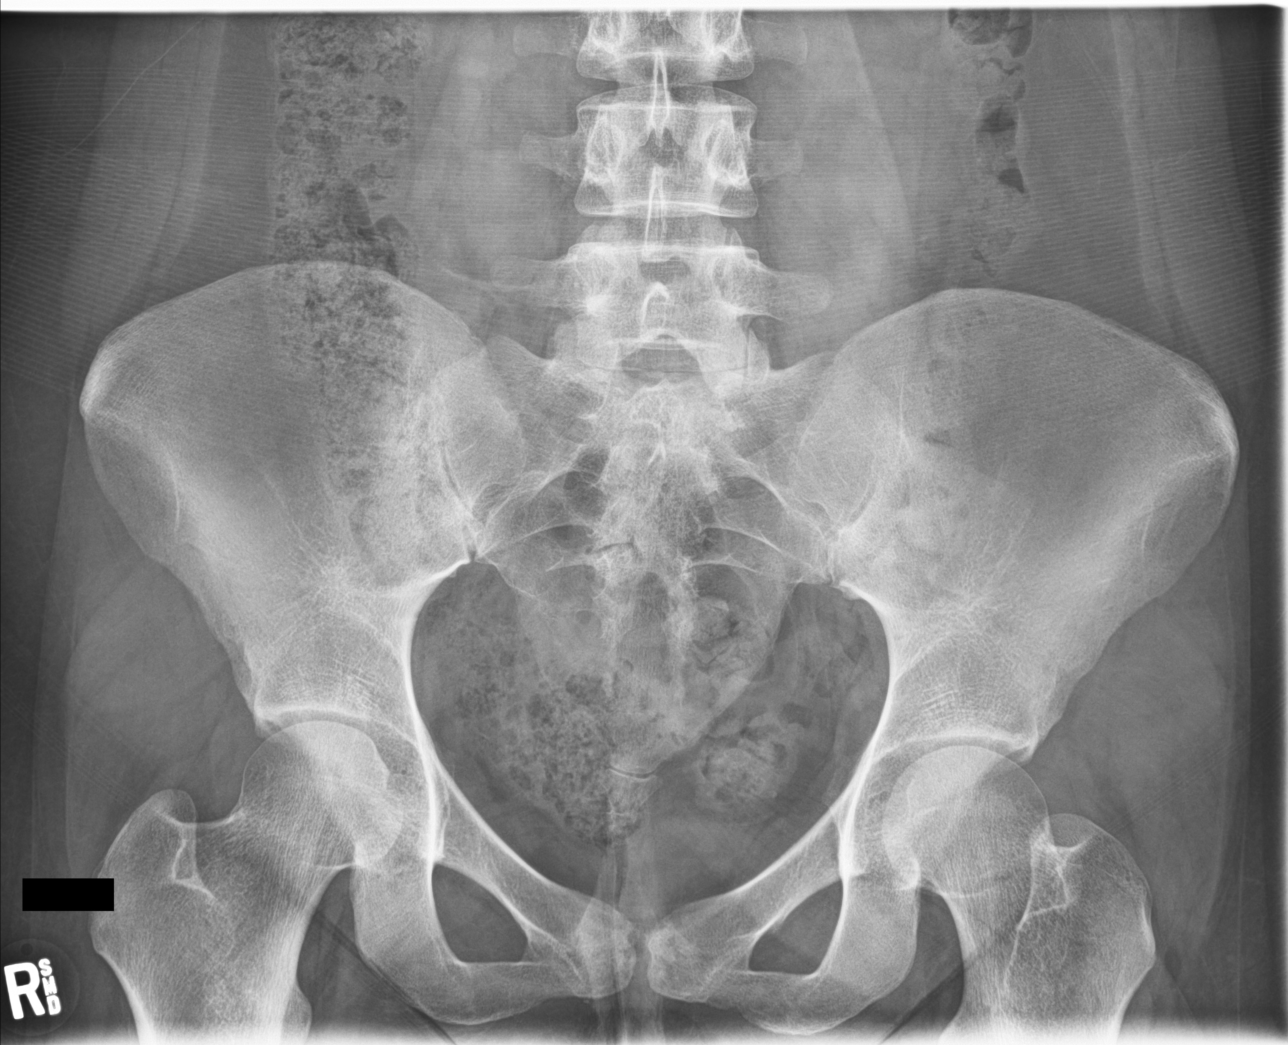

[abdomen supine (2 of 2)]
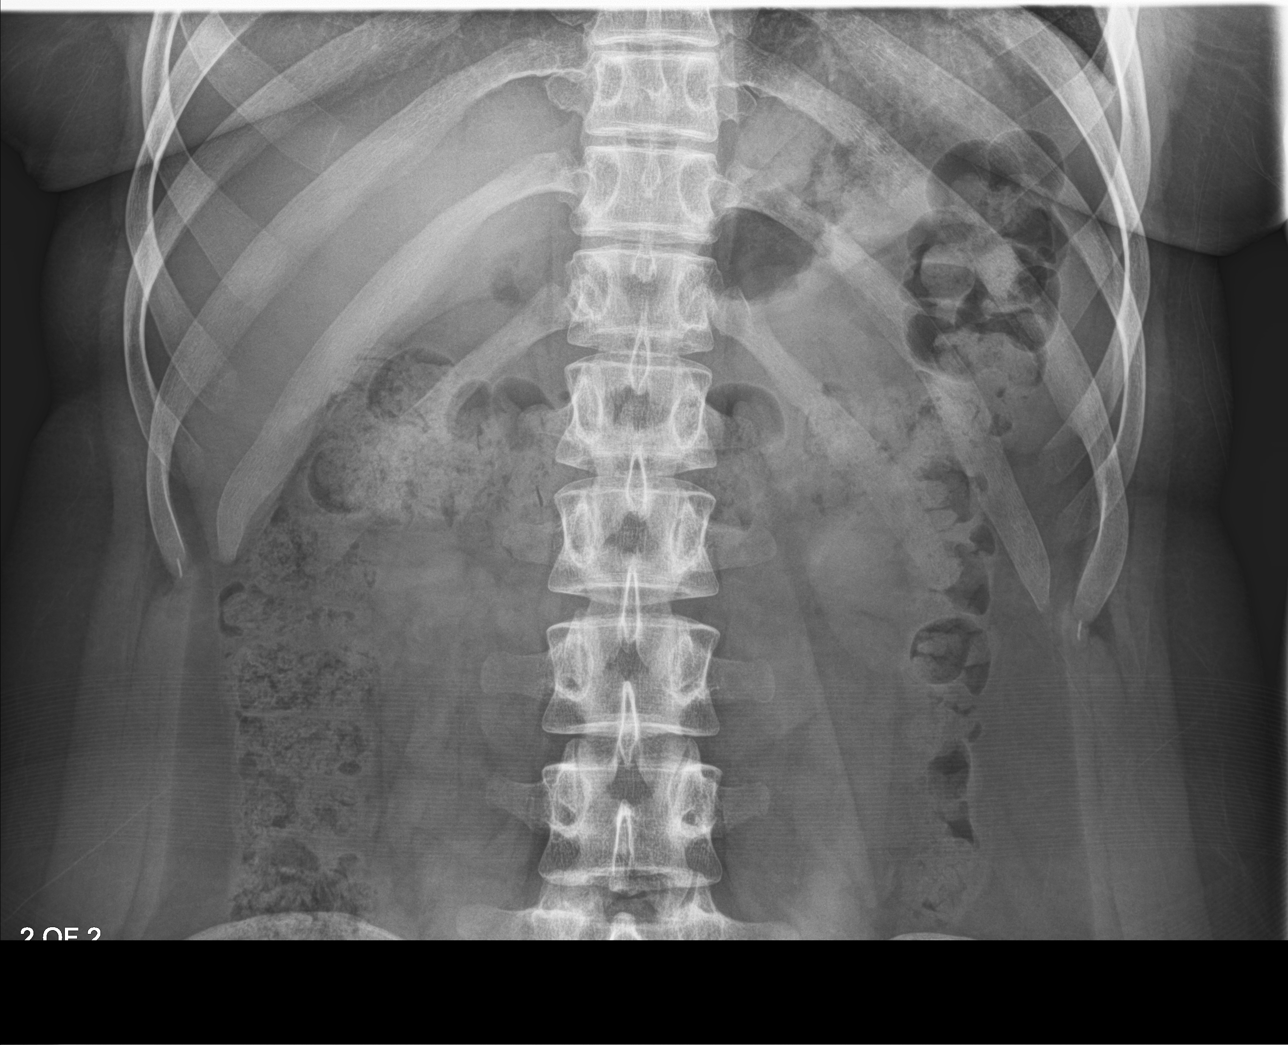

[4 of 4 positions shown; findings below may reference images not displayed]

FINDINGS: The bowel gas pattern is normal. There is no evidence of free air.
No radio-opaque calculi or other significant radiographic
abnormality is seen. Large amount of stool in the colon.
IMPRESSION: Negative.

## 2020-08-26 ENCOUNTER — Other Ambulatory Visit: Payer: Self-pay

## 2020-08-26 ENCOUNTER — Ambulatory Visit
Admission: RE | Admit: 2020-08-26 | Discharge: 2020-08-26 | Disposition: A | Discharge status: Home / Self Care | Payer: Medicaid Other | Source: Ambulatory Visit | Attending: Emergency Medicine | Admitting: Emergency Medicine

## 2020-08-26 VITALS — BP 149/82 | HR 68 | Temp 98.4°F | Resp 18 | Ht 69.5 in | Wt 200.0 lb

## 2020-08-26 DIAGNOSIS — J0141 Acute recurrent pansinusitis: Secondary | ICD-10-CM | POA: Diagnosis not present

## 2020-08-26 MED ORDER — CETIRIZINE HCL 10 MG PO TABS: 10.0000 mg | ORAL_TABLET | Freq: Every day | ORAL | 0 refills | Status: DC

## 2020-08-26 MED ORDER — AMOXICILLIN-POT CLAVULANATE 875-125 MG PO TABS: 1.0000 | ORAL_TABLET | Freq: Two times a day (BID) | ORAL | 0 refills | Status: DC

## 2020-08-26 MED ORDER — MOMETASONE FUROATE 50 MCG/ACT NA SUSP
2.0000 | Freq: Every day | NASAL | 0 refills | Status: DC
Start: 2020-08-26 — End: 2021-01-28

## 2020-08-26 NOTE — ED Triage Notes (Signed)
Pt c/o nasal congestion, facial pain, headache.. started about 6 days ago. She has been taking benadryl. Denies fever. Pt took at home covid test and was negative. Declines covid testing right now.

## 2020-08-26 NOTE — Discharge Instructions (Addendum)
Finish the augmentin, switch from Flonase to Nasonex.  Continue saline nasal irrigation with a Lloyd Huger Med rinse or Nettie pot as often as you want, discontinue Claritin, start Zyrtec.  Follow-up with ENT ASAP.

## 2020-08-26 NOTE — ED Provider Notes (Signed)
HPI  SUBJECTIVE:  Jessica Hebert is a 20 y.o. female who presents with 6 days of nasal congestion, yellow rhinorrhea, dry, crusty nasal mucosa, sinus pain and pressure, especially around her eyes.  Reports loss of sense of taste.  She states that this initially started with allergy symptoms with itchy, watery eyes, sneezing.  She denies upper dental pain, sore throat, postnasal drip, bodies, headaches, loss of sense of smell, cough, shortness of breath, nausea, vomiting, diarrhea, abdominal pain.  No known COVID exposure.  She got the second dose of the COVID-vaccine.  States that she had a negative home COVID test.  She has tried Benadryl, Flonase, Nettie pot.  The Nettie pot helps.  Symptoms are worse with going outside.  No antibiotics in the past month.  No antipyretic in the past 6 hours.  She has a past medical history of asthma, allergies, frequent sinusitis and is status post septoplasty.  She has had over 4 sinus infections this year.  LMP: 2 weeks ago.  Denies the possibility being pregnant.  [id number], Hermenia Fiscal, NP    Past Medical History:  Diagnosis Date   Acne    Anxiety    Asthma    Depression    Eating disorder, unspecified    Bulimia   Eczema    GERD (gastroesophageal reflux disease)    Headache    sinus related    Past Surgical History:  Procedure Laterality Date   ENDOSCOPIC CONCHA BULLOSA RESECTION Left 02/24/2018   Procedure: ENDOSCOPIC CONCHA BULLOSA REDUCTION VIA NASAL ENDOSCOPY;  Surgeon: Vernie Murders, MD;  Location: [facility];  Service: ENT;  Laterality: Left;   RHINOPLASTY  02/24/2018   septoplasty    SEPTOPLASTY Bilateral 02/24/2018   Procedure: SEPTOPLASTY;  Surgeon: Vernie Murders, MD;  Location: [facility];  Service: ENT;  Laterality: Bilateral;   SEPTOPLASTY Bilateral    TONSILLECTOMY     TONSILLECTOMY AND ADENOIDECTOMY N/A 05/27/2017   Procedure: TONSILLECTOMY AND ADENOIDECTOMY;  Surgeon: Vernie Murders, MD;  Location: [facility];  Service: ENT;  Laterality: N/A;   WISDOM TOOTH EXTRACTION  10/22/2017   four    Family History  Problem Relation Age of Onset   Breast cancer Maternal Grandmother 53   Hypertension Maternal Grandmother    Heart disease Maternal Grandmother    Stroke Maternal Grandmother 32   Depression Mother    Lung cancer Maternal Uncle    Drug abuse Father     Social History   Tobacco Use   Smoking status: Never   Smokeless tobacco: Never  Vaping Use   Vaping Use: Never used  Substance Use Topics   Alcohol use: Yes    Comment: social   Drug use: No    No current facility-administered medications for this encounter.  Current Outpatient Medications:    albuterol (PROVENTIL HFA;VENTOLIN HFA) 108 (90 Base) MCG/ACT inhaler, Inhale 2 puffs into the lungs every 4 (four) hours as needed for wheezing or shortness of breath., Disp: 1 Inhaler, Rfl: 0   amoxicillin-clavulanate (AUGMENTIN) 875-125 MG tablet, Take 1 tablet by mouth 2 (two) times daily. X 7 days, Disp: 14 tablet, Rfl: 0   cetirizine (ZYRTEC ALLERGY) 10 MG tablet, Take 1 tablet (10 mg total) by mouth daily., Disp: 30 tablet, Rfl: 0   levonorgestrel-ethinyl estradiol (SEASONALE) 0.15-0.03 MG tablet, Take 1 tablet by mouth daily., Disp: , Rfl:    mometasone (NASONEX) 50 MCG/ACT nasal spray, Place 2 sprays into the nose daily., Disp: 17 g, Rfl: 0  venlafaxine XR (EFFEXOR-XR) 75 MG 24 hr capsule, Take 225 mg by mouth daily., Disp: , Rfl:    amitriptyline (ELAVIL) 10 MG tablet, Take 1 tablet by mouth at bedtime., Disp: , Rfl:    DULoxetine (CYMBALTA) 30 MG capsule, Take 3 capsules by mouth daily., Disp: , Rfl:    EPINEPHrine 0.3 mg/0.3 mL IJ SOAJ injection, U UTD, Disp: , Rfl:    etonogestrel-ethinyl estradiol (NUVARING) 0.12-0.015 MG/24HR vaginal ring, INSERT 1 RING IN THE VAGINA AND LEAVE IN PLACE FOR 3 CONSECUTIVE WEEKS, THEN REMOVE FOR 1 WEEK AS DIRECTED., Disp: 3 each, Rfl: 3   ibuprofen (ADVIL) 600 MG tablet, Take 1  tablet (600 mg total) by mouth every 6 (six) hours as needed., Disp: 30 tablet, Rfl: 0   LORazepam (ATIVAN) 0.5 MG tablet, Take 1 tablet by mouth 2 (two) times daily., Disp: , Rfl:    Melatonin 10 MG TABS, Take by mouth., Disp: , Rfl:    montelukast (SINGULAIR) 10 MG tablet, Take 10 mg by mouth at bedtime., Disp: , Rfl:   Allergies  Allergen Reactions   Grapeseed Extract [Nutritional Supplements] Shortness Of Breath    Grapes cause wheezing   Other     Cats- swelling eyes; sneezing   Pollen Extract Other (See Comments)    Hickory tree - sneezing     ROS  As noted in HPI.   Physical Exam  BP (!) 149/82 (BP Location: Left Arm)   Pulse 68   Temp 98.4 F (36.9 C) (Oral)   Resp 18   Ht 5' 9.5" (1.765 m)   Wt 90.7 kg   LMP 08/23/2020   SpO2 100%   BMI 29.11 kg/m   Constitutional: Well developed, well nourished, no acute distress Eyes:  EOMI, conjunctiva normal bilaterally HENT: Normocephalic, atraumatic,mucus membranes moist. + purulent nasal congestion. Swollen red turbinates. +  maxillary sinus tenderness, + frontal sinus tenderness. Oropharynx normal. - postnasal drip.  Respiratory: Normal inspiratory effort Cardiovascular: Normal rate GI: nondistended skin: No rash, skin intact Musculoskeletal: no deformities Neurologic: Alert & oriented x 3, no focal neuro deficits Psychiatric: Speech and behavior appropriate   ED Course   Medications - No data to display  No orders of the defined types were placed in this encounter.   No results found for this or any previous visit (from the past 24 hour(s)). No results found.  ED Clinical Impression  Acute recurrent pansinusitis   ED Assessment/Plan  Presentation consistent with recurrent sinusitis from allergies.  Patient declined COVID testing.  Discussed with her that a negative home test does not necessarily rule out COVID infection.  She is no longer infectious, and states that she has to wear a mask at work, so  feel that this is reasonable.  Will start augmentin, switch from Flonase to Nasonex as she reports dry nasal mucosa from the Flonase.  Continue saline nasal irrigation, discontinue Claritin, start Zyrtec.  Follow-up with ENT ASAP since this is her fourth or fifth sinus infection this year.  Discussed MDM, treatment plan and plan for follow-up with pt.   *This clinic note was created using Dragon dictation software. Therefore, there may be occasional mistakes despite careful proofreading.  ?     Domenick Gong, MD 08/28/20 1724

## 2020-11-12 ENCOUNTER — Ambulatory Visit: Payer: Medicaid Other | Admitting: Obstetrics and Gynecology

## 2020-12-18 ENCOUNTER — Ambulatory Visit
Admission: EM | Admit: 2020-12-18 | Discharge: 2020-12-18 | Disposition: A | Discharge status: Home / Self Care | Payer: Medicaid Other | Attending: Emergency Medicine | Admitting: Emergency Medicine

## 2020-12-18 ENCOUNTER — Other Ambulatory Visit: Payer: Self-pay

## 2020-12-18 DIAGNOSIS — Z79899 Other long term (current) drug therapy: Secondary | ICD-10-CM | POA: Diagnosis not present

## 2020-12-18 DIAGNOSIS — Z7951 Long term (current) use of inhaled steroids: Secondary | ICD-10-CM | POA: Insufficient documentation

## 2020-12-18 DIAGNOSIS — J029 Acute pharyngitis, unspecified: Secondary | ICD-10-CM | POA: Insufficient documentation

## 2020-12-18 DIAGNOSIS — Z20822 Contact with and (suspected) exposure to covid-19: Secondary | ICD-10-CM | POA: Insufficient documentation

## 2020-12-18 DIAGNOSIS — Z793 Long term (current) use of hormonal contraceptives: Secondary | ICD-10-CM | POA: Insufficient documentation

## 2020-12-18 DIAGNOSIS — Z9089 Acquired absence of other organs: Secondary | ICD-10-CM | POA: Insufficient documentation

## 2020-12-18 DIAGNOSIS — J069 Acute upper respiratory infection, unspecified: Secondary | ICD-10-CM | POA: Insufficient documentation

## 2020-12-18 DIAGNOSIS — R051 Acute cough: Secondary | ICD-10-CM | POA: Diagnosis not present

## 2020-12-18 LAB — GROUP A STREP BY PCR: Group A Strep by PCR: NOT DETECTED

## 2020-12-18 LAB — SARS CORONAVIRUS 2 (TAT 6-24 HRS): SARS Coronavirus 2: NEGATIVE

## 2020-12-18 MED ORDER — BENZONATATE 100 MG PO CAPS: 200.0000 mg | ORAL_CAPSULE | Freq: Three times a day (TID) | ORAL | 0 refills | Status: DC

## 2020-12-18 MED ORDER — PROMETHAZINE-PHENYLEPHRINE 6.25-5 MG/5ML PO SYRP: 5.0000 mL | ORAL_SOLUTION | Freq: Four times a day (QID) | ORAL | 0 refills | Status: DC | PRN

## 2020-12-18 MED ORDER — IPRATROPIUM BROMIDE 0.06 % NA SOLN: 2.0000 | Freq: Four times a day (QID) | NASAL | 12 refills | Status: DC

## 2020-12-18 NOTE — Discharge Instructions (Signed)
Isolate at home pending the results of your COVID test.  If you test positive then you will have to quarantine for 5 days from the start of your symptoms.  After 5 days you can break quarantine if your symptoms have improved and you have not had a fever for 24 hours without taking Tylenol or ibuprofen.  Use over-the-counter Tylenol and ibuprofen as needed for body aches and fever.  Use the Tessalon Perles during the day as needed for cough and the Promethazine VC cough syrup at nighttime as will make you drowsy.  Use the Atrovent nasal spray, 2 squirts up each nostril every 6 hours, as needed for nasal congestion and runny nose.  If you develop any increased shortness of breath-especially at rest, you are unable to speak in full sentences, or is a late sign your lips are turning blue you need to go the ER for evaluation.

## 2020-12-18 NOTE — ED Triage Notes (Signed)
Pt presents with sore throat that began yesterday has h/o strp

## 2020-12-18 NOTE — ED Provider Notes (Signed)
[facility]    CSN: [id number] Arrival date & time: 12/18/20  7846      History   Chief Complaint Chief Complaint  Patient presents with   Sore Throat    HPI Jessica Hebert is a 20 y.o. female.   HPI  20 year old female here for evaluation of sore throat.  Patient reports that she developed a sore throat yesterday.  She is also having associated symptoms of a runny nose with clear nasal discharge, intermittent nonproductive cough, shortness of breath, and wheezing.  She denies any fever or GI symptoms.  She states that her throat pain significantly increases when she swallows and she feels like she has some constriction.  Patient has had a tonsillectomy.  She is able to speak in full sentences, is laughing during the history taking, and is in no acute distress.  Past Medical History:  Diagnosis Date   Acne    Anxiety    Asthma    Depression    Eating disorder, unspecified    Bulimia   Eczema    GERD (gastroesophageal reflux disease)    Headache    sinus related    Patient Active Problem List   Diagnosis Date Noted   ADHD (attention deficit hyperactivity disorder) 07/24/2019   Allergic rhinitis due to allergen 07/24/2019   Overweight 07/24/2019   Intractable migraine without aura and without status migrainosus 05/09/2018   Acne 07/19/2016   Asthma 07/19/2016   Anxiety and depression 07/19/2016    Past Surgical History:  Procedure Laterality Date   ENDOSCOPIC CONCHA BULLOSA RESECTION Left 02/24/2018   Procedure: ENDOSCOPIC CONCHA BULLOSA REDUCTION VIA NASAL ENDOSCOPY;  Surgeon: Vernie Murders, MD;  Location: [facility];  Service: ENT;  Laterality: Left;   RHINOPLASTY  02/24/2018   septoplasty    SEPTOPLASTY Bilateral 02/24/2018   Procedure: SEPTOPLASTY;  Surgeon: Vernie Murders, MD;  Location: [facility];  Service: ENT;  Laterality: Bilateral;   SEPTOPLASTY Bilateral    TONSILLECTOMY     TONSILLECTOMY AND ADENOIDECTOMY N/A  05/27/2017   Procedure: TONSILLECTOMY AND ADENOIDECTOMY;  Surgeon: Vernie Murders, MD;  Location: [facility];  Service: ENT;  Laterality: N/A;   WISDOM TOOTH EXTRACTION  10/22/2017   four    OB History     Gravida  0   Para  0   Term  0   Preterm  0   AB  0   Living  0      SAB  0   IAB  0   Ectopic  0   Multiple  0   Live Births  0            Home Medications    Prior to Admission medications   Medication Sig Start Date End Date Taking? Authorizing Provider  benzonatate (TESSALON) 100 MG capsule Take 2 capsules (200 mg total) by mouth every 8 (eight) hours. 12/18/20  Yes Becky Augusta, NP  ipratropium (ATROVENT) 0.06 % nasal spray Place 2 sprays into both nostrils 4 (four) times daily. 12/18/20  Yes Becky Augusta, NP  promethazine-phenylephrine (PROMETHAZINE VC) 6.25-5 MG/5ML SYRP Take 5 mLs by mouth every 6 (six) hours as needed for congestion. 12/18/20  Yes Becky Augusta, NP  albuterol (PROVENTIL HFA;VENTOLIN HFA) 108 (90 Base) MCG/ACT inhaler Inhale 2 puffs into the lungs every 4 (four) hours as needed for wheezing or shortness of breath. 09/01/17   Domenick Gong, MD  amitriptyline (ELAVIL) 10 MG tablet Take 1 tablet by mouth at bedtime.  03/22/19 03/21/20  [provider]  cetirizine (ZYRTEC ALLERGY) 10 MG tablet Take 1 tablet (10 mg total) by mouth daily. 08/26/20 09/25/20  Domenick Gong, MD  DULoxetine (CYMBALTA) 30 MG capsule Take 3 capsules by mouth daily. 09/28/18   [provider]  EPINEPHrine 0.3 mg/0.3 mL IJ SOAJ injection U UTD 03/25/18   [provider]  etonogestrel-ethinyl estradiol (NUVARING) 0.12-0.015 MG/24HR vaginal ring INSERT 1 RING IN THE VAGINA AND LEAVE IN PLACE FOR 3 CONSECUTIVE WEEKS, THEN REMOVE FOR 1 WEEK AS DIRECTED. 11/08/18   Farrel Conners, CNM  ibuprofen (ADVIL) 600 MG tablet Take 1 tablet (600 mg total) by mouth every 6 (six) hours as needed. 04/30/20   Domenick Gong, MD   levonorgestrel-ethinyl estradiol (SEASONALE) 0.15-0.03 MG tablet Take 1 tablet by mouth daily. 03/12/20   [provider]  LORazepam (ATIVAN) 0.5 MG tablet Take 1 tablet by mouth 2 (two) times daily. 09/28/18   [provider]  Melatonin 10 MG TABS Take by mouth.    [provider]  mometasone (NASONEX) 50 MCG/ACT nasal spray Place 2 sprays into the nose daily. 08/26/20   Domenick Gong, MD  montelukast (SINGULAIR) 10 MG tablet Take 10 mg by mouth at bedtime.    [provider]  venlafaxine XR (EFFEXOR-XR) 75 MG 24 hr capsule Take 225 mg by mouth daily. 03/07/20   [provider]  triamcinolone (NASACORT) 55 MCG/ACT AERO nasal inhaler Place into the nose.  10/03/18  [provider]    Family History Family History  Problem Relation Age of Onset   Breast cancer Maternal Grandmother 53   Hypertension Maternal Grandmother    Heart disease Maternal Grandmother    Stroke Maternal Grandmother 82   Depression Mother    Lung cancer Maternal Uncle    Drug abuse Father     Social History Social History   Tobacco Use   Smoking status: Never   Smokeless tobacco: Never  Vaping Use   Vaping Use: Never used  Substance Use Topics   Alcohol use: Yes    Comment: social   Drug use: No     Allergies   Grapeseed extract [nutritional supplements], Other, and Pollen extract   Review of Systems Review of Systems  Constitutional:  Negative for activity change, appetite change and fever.  HENT:  Positive for congestion, ear pain, rhinorrhea and sore throat. Negative for ear discharge.   Respiratory:  Positive for cough, shortness of breath and wheezing.   Gastrointestinal:  Negative for diarrhea, nausea and vomiting.  Skin:  Negative for rash.  Hematological: Negative.   Psychiatric/Behavioral: Negative.      Physical Exam Triage Vital Signs ED Triage Vitals  Enc Vitals Group     BP 12/18/20 0835 113/69     Pulse Rate 12/18/20 0835  88     Resp 12/18/20 0835 20     Temp 12/18/20 0835 98.6 F (37 C)     Temp src --      SpO2 12/18/20 0835 100 %     Weight --      Height --      Head Circumference --      Peak Flow --      Pain Score 12/18/20 0832 6     Pain Loc --      Pain Edu? --      Excl. in GC? --    No data found.  Updated Vital Signs BP 113/69   Pulse 88   Temp  98.6 F (37 C)   Resp 20   SpO2 100%   Visual Acuity Right Eye Distance:   Left Eye Distance:   Bilateral Distance:    Right Eye Near:   Left Eye Near:    Bilateral Near:     Physical Exam Vitals and nursing note reviewed.  Constitutional:      General: She is not in acute distress.    Appearance: Normal appearance. She is not ill-appearing.  HENT:     Head: Normocephalic and atraumatic.     Right Ear: Ear canal and external ear normal. There is no impacted cerumen.     Left Ear: Ear canal and external ear normal. There is no impacted cerumen.     Nose: Congestion and rhinorrhea present.     Mouth/Throat:     Mouth: Mucous membranes are moist.     Pharynx: Oropharynx is clear. Posterior oropharyngeal erythema present.  Cardiovascular:     Rate and Rhythm: Normal rate and regular rhythm.     Pulses: Normal pulses.     Heart sounds: Normal heart sounds. No murmur heard.   No gallop.  Pulmonary:     Effort: Pulmonary effort is normal.     Breath sounds: Wheezing present. No rhonchi or rales.  Musculoskeletal:     Cervical back: Normal range of motion and neck supple.  Lymphadenopathy:     Cervical: Cervical adenopathy present.  Skin:    General: Skin is warm and dry.     Capillary Refill: Capillary refill takes less than 2 seconds.     Findings: No erythema or rash.  Neurological:     General: No focal deficit present.     Mental Status: She is alert and oriented to person, place, and time.  Psychiatric:        Mood and Affect: Mood normal.        Behavior: Behavior normal.        Thought Content: Thought content  normal.        Judgment: Judgment normal.     UC Treatments / Results  Labs (all labs ordered are listed, but only abnormal results are displayed) Labs Reviewed  GROUP A STREP BY PCR  SARS CORONAVIRUS 2 (TAT 6-24 HRS)    EKG   Radiology No results found.  Procedures Procedures (including critical care time)  Medications Ordered in UC Medications - No data to display  Initial Impression / Assessment and Plan / UC Course  I have reviewed the triage vital signs and the nursing notes.  Pertinent labs & imaging results that were available during my care of the patient were reviewed by me and considered in my medical decision making (see chart for details).  Patient is a pleasant, nontoxic-appearing 20 year old female here for evaluation of upper respiratory symptoms with sore throat being the most significant.  The sore throat began yesterday.  Patient has had other associated upper respiratory symptoms include runny nose with clear nasal discharge, right ear pain that was present yesterday but resolved, and an intermittent nonproductive cough.  She also endorses shortness of breath and wheezing.  Patient's physical exam reveals bilateral erythematous tympanic membranes.  They have a normal light reflex and there is no effusion noted.  Both external auditory canals are clear.  Nasal mucosa is erythematous and edematous with scant clear nasal discharge.  Oropharyngeal exam reveals surgically absent tonsillar pillars with mild erythema to the posterior oropharynx with clear postnasal drip.  Patient does have bilateral anterior cervical  lymphadenopathy.  Cardiopulmonary exam reveals scattered expiratory wheezes in the right upper lung field that cleared after a few respiratory cycles.  Strep PCR was collected at triage.  We will also swab patient for COVID and have her isolate pending the results.  Strep PCR is negative.  Will discharge patient home to isolate pending the results of her COVID  test.  Will give Atrovent nasal spray to help with nasal congestion and Tessalon Perles and Promethazine DM cough syrup to help with cough and congestion.  Work note provided.   Final Clinical Impressions(s) / UC Diagnoses   Final diagnoses:  Upper respiratory tract infection, unspecified type  Sore throat  Acute cough     Discharge Instructions      Isolate at home pending the results of your COVID test.  If you test positive then you will have to quarantine for 5 days from the start of your symptoms.  After 5 days you can break quarantine if your symptoms have improved and you have not had a fever for 24 hours without taking Tylenol or ibuprofen.  Use over-the-counter Tylenol and ibuprofen as needed for body aches and fever.  Use the Tessalon Perles during the day as needed for cough and the Promethazine VC cough syrup at nighttime as will make you drowsy.  Use the Atrovent nasal spray, 2 squirts up each nostril every 6 hours, as needed for nasal congestion and runny nose.  If you develop any increased shortness of breath-especially at rest, you are unable to speak in full sentences, or is a late sign your lips are turning blue you need to go the ER for evaluation.      ED Prescriptions     Medication Sig Dispense Auth. Provider   benzonatate (TESSALON) 100 MG capsule Take 2 capsules (200 mg total) by mouth every 8 (eight) hours. 21 capsule Becky Augusta, NP   ipratropium (ATROVENT) 0.06 % nasal spray Place 2 sprays into both nostrils 4 (four) times daily. 15 mL Becky Augusta, NP   promethazine-phenylephrine (PROMETHAZINE VC) 6.25-5 MG/5ML SYRP Take 5 mLs by mouth every 6 (six) hours as needed for congestion. 180 mL Becky Augusta, NP      PDMP not reviewed this encounter.   Becky Augusta, NP 12/18/20 [phone]

## 2021-01-28 ENCOUNTER — Ambulatory Visit
Admission: EM | Admit: 2021-01-28 | Discharge: 2021-01-28 | Disposition: A | Discharge status: Home / Self Care | Payer: BC Managed Care – PPO | Attending: Physician Assistant | Admitting: Physician Assistant

## 2021-01-28 ENCOUNTER — Other Ambulatory Visit: Payer: Self-pay

## 2021-01-28 DIAGNOSIS — J069 Acute upper respiratory infection, unspecified: Secondary | ICD-10-CM | POA: Diagnosis not present

## 2021-01-28 DIAGNOSIS — R0981 Nasal congestion: Secondary | ICD-10-CM

## 2021-01-28 DIAGNOSIS — R051 Acute cough: Secondary | ICD-10-CM | POA: Insufficient documentation

## 2021-01-28 DIAGNOSIS — Z20822 Contact with and (suspected) exposure to covid-19: Secondary | ICD-10-CM | POA: Insufficient documentation

## 2021-01-28 MED ORDER — FLUTICASONE PROPIONATE 50 MCG/ACT NA SUSP: 2.0000 | Freq: Every day | NASAL | 0 refills | Status: DC

## 2021-01-28 MED ORDER — PROMETHAZINE-DM 6.25-15 MG/5ML PO SYRP: 5.0000 mL | ORAL_SOLUTION | Freq: Four times a day (QID) | ORAL | 0 refills | Status: DC | PRN

## 2021-01-28 NOTE — ED Triage Notes (Signed)
Pt c/o of congestion,cough, cold chills x 4 days. Pt has diarrhea x 4 days.

## 2021-01-28 NOTE — ED Provider Notes (Signed)
[facility]    CSN: [id number] Arrival date & time: 01/28/21  0935      History   Chief Complaint Chief Complaint  Patient presents with   Cough   Nasal Congestion    HPI Jessica Hebert is a 20 y.o. female presenting for 4-day history of nasal congestion, cough, headaches, chills and diarrhea.  Patient reports that her stepfather has had similar symptoms but was never checked out.  She denies known COVID or flu exposure.  Patient concerned about possible sinus infection or the flu.  Denies any fevers.  Does not report any sinus pain, ear pain, chest pain, breathing difficulty, nausea/vomiting.  Has not taken any OTC meds today.  No other complaints.  HPI  Past Medical History:  Diagnosis Date   Acne    Anxiety    Asthma    Depression    Eating disorder, unspecified    Bulimia   Eczema    GERD (gastroesophageal reflux disease)    Headache    sinus related    Patient Active Problem List   Diagnosis Date Noted   ADHD (attention deficit hyperactivity disorder) 07/24/2019   Allergic rhinitis due to allergen 07/24/2019   Overweight 07/24/2019   Intractable migraine without aura and without status migrainosus 05/09/2018   Acne 07/19/2016   Asthma 07/19/2016   Anxiety and depression 07/19/2016    Past Surgical History:  Procedure Laterality Date   ENDOSCOPIC CONCHA BULLOSA RESECTION Left 02/24/2018   Procedure: ENDOSCOPIC CONCHA BULLOSA REDUCTION VIA NASAL ENDOSCOPY;  Surgeon: Vernie Murders, MD;  Location: [facility];  Service: ENT;  Laterality: Left;   RHINOPLASTY  02/24/2018   septoplasty    SEPTOPLASTY Bilateral 02/24/2018   Procedure: SEPTOPLASTY;  Surgeon: Vernie Murders, MD;  Location: [facility];  Service: ENT;  Laterality: Bilateral;   SEPTOPLASTY Bilateral    TONSILLECTOMY     TONSILLECTOMY AND ADENOIDECTOMY N/A 05/27/2017   Procedure: TONSILLECTOMY AND ADENOIDECTOMY;  Surgeon: Vernie Murders, MD;  Location: [facility];  Service: ENT;  Laterality: N/A;   WISDOM TOOTH EXTRACTION  10/22/2017   four    OB History     Gravida  0   Para  0   Term  0   Preterm  0   AB  0   Living  0      SAB  0   IAB  0   Ectopic  0   Multiple  0   Live Births  0            Home Medications    Prior to Admission medications   Medication Sig Start Date End Date Taking? Authorizing Provider  fluticasone (FLONASE) 50 MCG/ACT nasal spray Place 2 sprays into both nostrils daily. 01/28/21  Yes Shirlee Latch, PA-C  levonorgestrel-ethinyl estradiol (SEASONALE) 0.15-0.03 MG tablet Take 1 tablet by mouth daily. 03/12/20  Yes [provider]  promethazine-dextromethorphan (PROMETHAZINE-DM) 6.25-15 MG/5ML syrup Take 5 mLs by mouth 4 (four) times daily as needed for cough. 01/28/21  Yes Eusebio Friendly B, PA-C  venlafaxine XR (EFFEXOR-XR) 75 MG 24 hr capsule Take 225 mg by mouth daily. 03/07/20  Yes [provider]  albuterol (PROVENTIL HFA;VENTOLIN HFA) 108 (90 Base) MCG/ACT inhaler Inhale 2 puffs into the lungs every 4 (four) hours as needed for wheezing or shortness of breath. 09/01/17   Domenick Gong, MD  amitriptyline (ELAVIL) 10 MG tablet Take 1 tablet by mouth at bedtime. 03/22/19 03/21/20  [provider]  cetirizine (ZYRTEC ALLERGY) 10 MG tablet Take 1 tablet (10 mg total) by mouth daily. 08/26/20 09/25/20  Domenick Gong, MD  DULoxetine (CYMBALTA) 30 MG capsule Take 3 capsules by mouth daily. 09/28/18   [provider]  EPINEPHrine 0.3 mg/0.3 mL IJ SOAJ injection U UTD 03/25/18   [provider]  etonogestrel-ethinyl estradiol (NUVARING) 0.12-0.015 MG/24HR vaginal ring INSERT 1 RING IN THE VAGINA AND LEAVE IN PLACE FOR 3 CONSECUTIVE WEEKS, THEN REMOVE FOR 1 WEEK AS DIRECTED. 11/08/18   Farrel Conners, CNM  ibuprofen (ADVIL) 600 MG tablet Take 1 tablet (600 mg total) by mouth every 6 (six) hours as needed. 04/30/20   Domenick Gong, MD  LORazepam (ATIVAN)  0.5 MG tablet Take 1 tablet by mouth 2 (two) times daily. 09/28/18   [provider]  Melatonin 10 MG TABS Take by mouth.    [provider]  montelukast (SINGULAIR) 10 MG tablet Take 10 mg by mouth at bedtime.    [provider]  triamcinolone (NASACORT) 55 MCG/ACT AERO nasal inhaler Place into the nose.  10/03/18  [provider]    Family History Family History  Problem Relation Age of Onset   Breast cancer Maternal Grandmother 53   Hypertension Maternal Grandmother    Heart disease Maternal Grandmother    Stroke Maternal Grandmother 62   Depression Mother    Lung cancer Maternal Uncle    Drug abuse Father     Social History Social History   Tobacco Use   Smoking status: Never   Smokeless tobacco: Never  Vaping Use   Vaping Use: Never used  Substance Use Topics   Alcohol use: Yes    Comment: social   Drug use: No     Allergies   Grapeseed extract [nutritional supplements], Other, and Pollen extract   Review of Systems Review of Systems  Constitutional:  Positive for chills and fatigue. Negative for diaphoresis and fever.  HENT:  Positive for congestion, postnasal drip and rhinorrhea. Negative for ear pain, sinus pressure, sinus pain and sore throat.   Respiratory:  Positive for cough. Negative for shortness of breath.   Cardiovascular:  Negative for chest pain.  Gastrointestinal:  Positive for diarrhea. Negative for abdominal pain, nausea and vomiting.  Musculoskeletal:  Negative for arthralgias and myalgias.  Skin:  Negative for rash.  Neurological:  Positive for headaches. Negative for weakness.  Hematological:  Negative for adenopathy.    Physical Exam Triage Vital Signs ED Triage Vitals  Enc Vitals Group     BP 01/28/21 1003 107/71     Pulse Rate 01/28/21 1003 79     Resp 01/28/21 1003 16     Temp 01/28/21 1003 98.1 F (36.7 C)     Temp Source 01/28/21 1003 Oral     SpO2 01/28/21 1003 100 %     Weight 01/28/21 1001  199 lb 15.3 oz (90.7 kg)     Height 01/28/21 1001 5' 9.5" (1.765 m)     Head Circumference --      Peak Flow --      Pain Score 01/28/21 1001 0     Pain Loc --      Pain Edu? --      Excl. in GC? --    No data found.  Updated Vital Signs BP 107/71 (BP Location: Left Arm)   Pulse 79   Temp 98.1 F (36.7 C) (Oral)   Resp 16   Ht 5' 9.5" (1.765 m)  Wt 199 lb 15.3 oz (90.7 kg)   SpO2 100%   BMI 29.11 kg/m     Physical Exam Vitals and nursing note reviewed.  Constitutional:      General: She is not in acute distress.    Appearance: Normal appearance. She is ill-appearing. She is not toxic-appearing.  HENT:     Head: Normocephalic and atraumatic.     Nose: Congestion and rhinorrhea present.     Mouth/Throat:     Mouth: Mucous membranes are moist.     Pharynx: Oropharynx is clear.  Eyes:     General: No scleral icterus.       Right eye: No discharge.        Left eye: No discharge.     Conjunctiva/sclera: Conjunctivae normal.  Cardiovascular:     Rate and Rhythm: Normal rate and regular rhythm.     Heart sounds: Normal heart sounds.  Pulmonary:     Effort: Pulmonary effort is normal. No respiratory distress.     Breath sounds: Normal breath sounds.  Musculoskeletal:     Cervical back: Neck supple.  Skin:    General: Skin is dry.  Neurological:     General: No focal deficit present.     Mental Status: She is alert. Mental status is at baseline.     Motor: No weakness.     Gait: Gait normal.  Psychiatric:        Mood and Affect: Mood normal.        Behavior: Behavior normal.        Thought Content: Thought content normal.     UC Treatments / Results  Labs (all labs ordered are listed, but only abnormal results are displayed) Labs Reviewed  SARS CORONAVIRUS 2 (TAT 6-24 HRS)    EKG   Radiology No results found.  Procedures Procedures (including critical care time)  Medications Ordered in UC Medications - No data to display  Initial Impression /  Assessment and Plan / UC Course  I have reviewed the triage vital signs and the nursing notes.  Pertinent labs & imaging results that were available during my care of the patient were reviewed by me and considered in my medical decision making (see chart for details).   20 year old female presenting for 4-day history of cough, congestion, chills, diarrhea, fatigue and headaches.  No associated fevers.  Send a COVID test performed.  Current CDC guidelines, isolation protocol and ED precautions reviewed with patient.  Advised patient that based on her clinical presentation, her symptoms are consistent with viral URI.  Supportive care encouraged with increasing rest and fluids.  I have sent Promethazine DM and Flonase to pharmacy.  Reviewed return and ED precautions and provided with a work note.   Final Clinical Impressions(s) / UC Diagnoses   Final diagnoses:  Viral upper respiratory tract infection  Acute cough  Nasal congestion     Discharge Instructions      URI/COLD SYMPTOMS: Your exam today is consistent with a viral illness. Antibiotics are not indicated at this time. Use medications as directed, including cough syrup, nasal saline, and decongestants. Your symptoms should improve over the next few days and resolve within 7-10 days. Increase rest and fluids. F/u if symptoms worsen or predominate such as sore throat, ear pain, productive cough, shortness of breath, or if you develop high fevers or worsening fatigue over the next several days.    You have received COVID testing today either for positive exposure, concerning symptoms that could be  related to COVID infection, screening purposes, or re-testing after confirmed positive.  Your test obtained today checks for active viral infection in the last 1-2 weeks. If your test is negative now, you can still test positive later. So, if you do develop symptoms you should either get re-tested and/or isolate x 5 days and then strict mask use  x 5 days (unvaccinated) or mask use x 10 days (vaccinated). Please follow CDC guidelines.  While Rapid antigen tests come back in 15-20 minutes, send out PCR/molecular test results typically come back within 1-3 days. In the mean time, if you are symptomatic, assume this could be a positive test and treat/monitor yourself as if you do have COVID.   We will call with test results if positive. Please download the MyChart app and set up a profile to access test results.   If symptomatic, go home and rest. Push fluids. Take Tylenol as needed for discomfort. Gargle warm salt water. Throat lozenges. Take Mucinex DM or Robitussin for cough. Humidifier in bedroom to ease coughing. Warm showers. Also review the COVID handout for more information.  COVID-19 INFECTION: The incubation period of COVID-19 is approximately 14 days after exposure, with most symptoms developing in roughly 4-5 days. Symptoms may range in severity from mild to critically severe. Roughly 80% of those infected will have mild symptoms. People of any age may become infected with COVID-19 and have the ability to transmit the virus. The most common symptoms include: fever, fatigue, cough, body aches, headaches, sore throat, nasal congestion, shortness of breath, nausea, vomiting, diarrhea, changes in smell and/or taste.    COURSE OF ILLNESS Some patients may begin with mild disease which can progress quickly into critical symptoms. If your symptoms are worsening please call ahead to the Emergency Department and proceed there for further treatment. Recovery time appears to be roughly 1-2 weeks for mild symptoms and 3-6 weeks for severe disease.   GO IMMEDIATELY TO ER FOR FEVER YOU ARE UNABLE TO GET DOWN WITH TYLENOL, BREATHING PROBLEMS, CHEST PAIN, FATIGUE, LETHARGY, INABILITY TO EAT OR DRINK, ETC  QUARANTINE AND ISOLATION: To help decrease the spread of COVID-19 please remain isolated if you have COVID infection or are highly suspected to have  COVID infection. This means -stay home and isolate to one room in the home if you live with others. Do not share a bed or bathroom with others while ill, sanitize and wipe down all countertops and keep common areas clean and disinfected. Stay home for 5 days. If you have no symptoms or your symptoms are resolving after 5 days, you can leave your house. Continue to wear a mask around others for 5 additional days. If you have been in close contact (within 6 feet) of someone diagnosed with COVID 19, you are advised to quarantine in your home for 14 days as symptoms can develop anywhere from 2-14 days after exposure to the virus. If you develop symptoms, you  must isolate.  Most current guidelines for COVID after exposure -unvaccinated: isolate 5 days and strict mask use x 5 days. Test on day 5 is possible -vaccinated: wear mask x 10 days if symptoms do not develop -You do not necessarily need to be tested for COVID if you have + exposure and  develop symptoms. Just isolate at home x10 days from symptom onset During this global pandemic, CDC advises to practice social distancing, try to stay at least 52ft away from others at all times. Wear a face covering. Wash and sanitize  your hands regularly and avoid going anywhere that is not necessary.  KEEP IN MIND THAT THE COVID TEST IS NOT 100% ACCURATE AND YOU SHOULD STILL DO EVERYTHING TO PREVENT POTENTIAL SPREAD OF VIRUS TO OTHERS (WEAR MASK, WEAR GLOVES, WASH HANDS AND SANITIZE REGULARLY). IF INITIAL TEST IS NEGATIVE, THIS MAY NOT MEAN YOU ARE DEFINITELY NEGATIVE. MOST ACCURATE TESTING IS DONE 5-7 DAYS AFTER EXPOSURE.   It is not advised by CDC to get re-tested after receiving a positive COVID test since you can still test positive for weeks to months after you have already cleared the virus.   *If you have not been vaccinated for COVID, I strongly suggest you consider getting vaccinated as long as there are no contraindications.       ED Prescriptions      Medication Sig Dispense Auth. Provider   promethazine-dextromethorphan (PROMETHAZINE-DM) 6.25-15 MG/5ML syrup Take 5 mLs by mouth 4 (four) times daily as needed for cough. 118 mL Eusebio Friendly B, PA-C   fluticasone (FLONASE) 50 MCG/ACT nasal spray Place 2 sprays into both nostrils daily. 1 g Shirlee Latch, PA-C      PDMP not reviewed this encounter.   Shirlee Latch, PA-C 01/28/21 1131

## 2021-01-28 NOTE — Discharge Instructions (Signed)

## 2021-01-29 LAB — SARS CORONAVIRUS 2 (TAT 6-24 HRS): SARS Coronavirus 2: NEGATIVE

## 2021-09-30 ENCOUNTER — Ambulatory Visit
Admission: EM | Admit: 2021-09-30 | Discharge: 2021-09-30 | Disposition: A | Discharge status: Home / Self Care | Payer: Medicaid Other | Attending: Emergency Medicine | Admitting: Emergency Medicine

## 2021-09-30 DIAGNOSIS — J029 Acute pharyngitis, unspecified: Secondary | ICD-10-CM | POA: Diagnosis present

## 2021-09-30 LAB — GROUP A STREP BY PCR: Group A Strep by PCR: NOT DETECTED

## 2021-09-30 MED ORDER — LIDOCAINE VISCOUS HCL 2 % MT SOLN: 15.0000 mL | Freq: Four times a day (QID) | OROMUCOSAL | 0 refills | Status: DC | PRN

## 2021-09-30 NOTE — ED Provider Notes (Signed)
[facility]    CSN: [id number] Arrival date & time: 09/30/21  1152      History   Chief Complaint Chief Complaint  Patient presents with   Sore Throat    HPI Jessica Hebert is a 21 y.o. female.   HPI  21 year old female here for evaluation of sore throat.  Patient reports that 4 days ago she was feeling fatigued that she woke up this morning complaining of sore throat.  She states that it burns in the back of her throat and brings when she swallows.  She is also complaining of pain in her right ear.  She denies any measured fever, runny nose, nasal congestion, cough, or known sick contacts.  Past Medical History:  Diagnosis Date   Acne    Anxiety    Asthma    Depression    Eating disorder, unspecified    Bulimia   Eczema    GERD (gastroesophageal reflux disease)    Headache    sinus related    Patient Active Problem List   Diagnosis Date Noted   ADHD (attention deficit hyperactivity disorder) 07/24/2019   Allergic rhinitis due to allergen 07/24/2019   Overweight 07/24/2019   Intractable migraine without aura and without status migrainosus 05/09/2018   Acne 07/19/2016   Asthma 07/19/2016   Anxiety and depression 07/19/2016    Past Surgical History:  Procedure Laterality Date   ENDOSCOPIC CONCHA BULLOSA RESECTION Left 02/24/2018   Procedure: ENDOSCOPIC CONCHA BULLOSA REDUCTION VIA NASAL ENDOSCOPY;  Surgeon: Vernie Murders, MD;  Location: [facility];  Service: ENT;  Laterality: Left;   RHINOPLASTY  02/24/2018   septoplasty    SEPTOPLASTY Bilateral 02/24/2018   Procedure: SEPTOPLASTY;  Surgeon: Vernie Murders, MD;  Location: [facility];  Service: ENT;  Laterality: Bilateral;   SEPTOPLASTY Bilateral    TONSILLECTOMY     TONSILLECTOMY AND ADENOIDECTOMY N/A 05/27/2017   Procedure: TONSILLECTOMY AND ADENOIDECTOMY;  Surgeon: Vernie Murders, MD;  Location: [facility];  Service: ENT;  Laterality: N/A;   WISDOM TOOTH EXTRACTION   10/22/2017   four    OB History     Gravida  0   Para  0   Term  0   Preterm  0   AB  0   Living  0      SAB  0   IAB  0   Ectopic  0   Multiple  0   Live Births  0            Home Medications    Prior to Admission medications   Medication Sig Start Date End Date Taking? Authorizing Provider  EPINEPHrine 0.3 mg/0.3 mL IJ SOAJ injection U UTD 03/25/18  Yes [provider]  etonogestrel-ethinyl estradiol (NUVARING) 0.12-0.015 MG/24HR vaginal ring INSERT 1 RING IN THE VAGINA AND LEAVE IN PLACE FOR 3 CONSECUTIVE WEEKS, THEN REMOVE FOR 1 WEEK AS DIRECTED. 11/08/18  Yes Farrel Conners, CNM  lidocaine (XYLOCAINE) 2 % solution Use as directed 15 mLs in the mouth or throat every 6 (six) hours as needed for mouth pain. 09/30/21  Yes Becky Augusta, NP  albuterol (PROVENTIL HFA;VENTOLIN HFA) 108 (90 Base) MCG/ACT inhaler Inhale 2 puffs into the lungs every 4 (four) hours as needed for wheezing or shortness of breath. 09/01/17   Domenick Gong, MD  cetirizine (ZYRTEC ALLERGY) 10 MG tablet Take 1 tablet (10 mg total) by mouth daily. 08/26/20 09/25/20  Domenick Gong, MD  fluticasone (FLONASE) 50 MCG/ACT nasal  spray Place 2 sprays into both nostrils daily. 01/28/21   Shirlee Latch, PA-C  ibuprofen (ADVIL) 600 MG tablet Take 1 tablet (600 mg total) by mouth every 6 (six) hours as needed. 04/30/20   Domenick Gong, MD  levonorgestrel-ethinyl estradiol (SEASONALE) 0.15-0.03 MG tablet Take 1 tablet by mouth daily. 03/12/20   [provider]  LORazepam (ATIVAN) 0.5 MG tablet Take 1 tablet by mouth 2 (two) times daily. 09/28/18   [provider]  montelukast (SINGULAIR) 10 MG tablet Take 10 mg by mouth at bedtime.    [provider]  triamcinolone (NASACORT) 55 MCG/ACT AERO nasal inhaler Place into the nose.  10/03/18  [provider]    Family History Family History  Problem Relation Age of Onset   Breast cancer Maternal Grandmother  53   Hypertension Maternal Grandmother    Heart disease Maternal Grandmother    Stroke Maternal Grandmother 40   Depression Mother    Lung cancer Maternal Uncle    Drug abuse Father     Social History Social History   Tobacco Use   Smoking status: Never   Smokeless tobacco: Never  Vaping Use   Vaping Use: Never used  Substance Use Topics   Alcohol use: Yes    Comment: social   Drug use: No     Allergies   Grapeseed extract [nutritional supplements], Other, and Pollen extract   Review of Systems Review of Systems  Constitutional:  Positive for fatigue. Negative for fever.  HENT:  Positive for ear pain and sore throat. Negative for congestion and rhinorrhea.   Respiratory:  Negative for cough.   Hematological: Negative.   Psychiatric/Behavioral: Negative.       Physical Exam Triage Vital Signs ED Triage Vitals  Enc Vitals Group     BP 09/30/21 1223 (!) 123/98     Pulse Rate 09/30/21 1223 90     Resp --      Temp 09/30/21 1223 98.4 F (36.9 C)     Temp Source 09/30/21 1223 Oral     SpO2 09/30/21 1223 98 %     Weight 09/30/21 1219 175 lb (79.4 kg)     Height 09/30/21 1219 5' 9.5" (1.765 m)     Head Circumference --      Peak Flow --      Pain Score 09/30/21 1219 5     Pain Loc --      Pain Edu? --      Excl. in GC? --    No data found.  Updated Vital Signs BP (!) 123/98 (BP Location: Left Arm)   Pulse 90   Temp 98.4 F (36.9 C) (Oral)   Ht 5' 9.5" (1.765 m)   Wt 175 lb (79.4 kg)   LMP 09/22/2021 (Approximate)   SpO2 98%   BMI 25.47 kg/m   Visual Acuity Right Eye Distance:   Left Eye Distance:   Bilateral Distance:    Right Eye Near:   Left Eye Near:    Bilateral Near:     Physical Exam Vitals and nursing note reviewed.  Constitutional:      Appearance: Normal appearance. She is not ill-appearing.  HENT:     Head: Normocephalic and atraumatic.     Right Ear: Tympanic membrane, ear canal and external ear normal. There is no impacted  cerumen.     Left Ear: Tympanic membrane, ear canal and external ear normal. There is no impacted cerumen.     Nose: Nose normal.  No congestion or rhinorrhea.     Mouth/Throat:     Mouth: Mucous membranes are moist.     Pharynx: Oropharynx is clear. Posterior oropharyngeal erythema present. No oropharyngeal exudate.  Cardiovascular:     Rate and Rhythm: Normal rate and regular rhythm.     Pulses: Normal pulses.     Heart sounds: Normal heart sounds. No murmur heard.    No friction rub. No gallop.  Pulmonary:     Effort: Pulmonary effort is normal.     Breath sounds: Normal breath sounds. No wheezing, rhonchi or rales.  Musculoskeletal:     Cervical back: Normal range of motion and neck supple.  Lymphadenopathy:     Cervical: Cervical adenopathy present.  Skin:    General: Skin is warm and dry.     Capillary Refill: Capillary refill takes less than 2 seconds.     Findings: No rash.  Neurological:     General: No focal deficit present.     Mental Status: She is alert and oriented to person, place, and time.  Psychiatric:        Mood and Affect: Mood normal.        Behavior: Behavior normal.        Thought Content: Thought content normal.        Judgment: Judgment normal.      UC Treatments / Results  Labs (all labs ordered are listed, but only abnormal results are displayed) Labs Reviewed  GROUP A STREP BY PCR    EKG   Radiology No results found.  Procedures Procedures (including critical care time)  Medications Ordered in UC Medications - No data to display  Initial Impression / Assessment and Plan / UC Course  I have reviewed the triage vital signs and the nursing notes.  Pertinent labs & imaging results that were available during my care of the patient were reviewed by me and considered in my medical decision making (see chart for details).  Patient is a nontoxic-appearing 21 year old female here for evaluation of fatigue, sore throat, and right ear pain as  outlined in the HPI above.  Her physical exam reveals pearly-gray tympanic membranes bilaterally with normal light reflex.  There is mild amounts of dried cerumen in both external auditory canals but it is not impacted or occlusive.  Nasal mucosa is unremarkable.  Oropharyngeal exam reveals 2+ erythematous edematous tonsillar pillars without exudate.  Patient does have bilateral anterior cervical lymphadenopathy on exam.  Cardiopulmonary exam reveals S1-S2 heart sounds with regular rate and rhythm and lung sounds that are clear to auscultation all fields.  Will check strep PCR.  Strep PCR is negative.  I will diagnose patient with viral pharyngitis and discharged home with supportive care.  I will prescribe viscous lidocaine the patient can use before meals at bedtime as needed for sore throat pain.   Final Clinical Impressions(s) / UC Diagnoses   Final diagnoses:  Viral pharyngitis     Discharge Instructions      Your strep test today was negative.  Use the viscous lidocaine, 1 tablespoon gargle and spit, 15 minutes before meals and at bedtime as needed for throat pain relief.  Gargle with warm salt water 2-3 times a day to soothe your throat, aid in pain relief, and aid in healing.  Take over-the-counter ibuprofen according to the package instructions as needed for pain.  You can also use Chloraseptic or Sucrets lozenges, 1 lozenge every 2 hours as needed for throat pain.  If you  develop any new or worsening symptoms return for reevaluation.      ED Prescriptions     Medication Sig Dispense Auth. Provider   lidocaine (XYLOCAINE) 2 % solution Use as directed 15 mLs in the mouth or throat every 6 (six) hours as needed for mouth pain. 100 mL Becky Augusta, NP      PDMP not reviewed this encounter.   Becky Augusta, NP 09/30/21 1256

## 2021-09-30 NOTE — ED Triage Notes (Signed)
Patient reports that Friday she was very tired. This morning she woke up with a sore throat.

## 2021-09-30 NOTE — Discharge Instructions (Addendum)
Your strep test today was negative.  Use the viscous lidocaine, 1 tablespoon gargle and spit, 15 minutes before meals and at bedtime as needed for throat pain relief.  Gargle with warm salt water 2-3 times a day to soothe your throat, aid in pain relief, and aid in healing.  Take over-the-counter ibuprofen according to the package instructions as needed for pain.  You can also use Chloraseptic or Sucrets lozenges, 1 lozenge every 2 hours as needed for throat pain.  If you develop any new or worsening symptoms return for reevaluation.

## 2021-10-25 ENCOUNTER — Encounter: Payer: Self-pay | Admitting: Emergency Medicine

## 2021-10-25 ENCOUNTER — Ambulatory Visit
Admission: EM | Admit: 2021-10-25 | Discharge: 2021-10-25 | Disposition: A | Discharge status: Home / Self Care | Payer: Medicaid Other

## 2021-10-25 ENCOUNTER — Ambulatory Visit (INDEPENDENT_AMBULATORY_CARE_PROVIDER_SITE_OTHER): Payer: Medicaid Other

## 2021-10-25 DIAGNOSIS — W19XXXA Unspecified fall, initial encounter: Secondary | ICD-10-CM

## 2021-10-25 DIAGNOSIS — M25571 Pain in right ankle and joints of right foot: Secondary | ICD-10-CM | POA: Diagnosis not present

## 2021-10-25 DIAGNOSIS — S82831A Other fracture of upper and lower end of right fibula, initial encounter for closed fracture: Secondary | ICD-10-CM

## 2021-10-25 DIAGNOSIS — S92351A Displaced fracture of fifth metatarsal bone, right foot, initial encounter for closed fracture: Secondary | ICD-10-CM | POA: Diagnosis not present

## 2021-10-25 MED ORDER — MELOXICAM 7.5 MG PO TABS: 7.5000 mg | ORAL_TABLET | Freq: Every day | ORAL | 0 refills | Status: DC
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
## 2021-10-25 NOTE — ED Provider Notes (Signed)
[facility]    CSN: [id number] Arrival date & time: 10/25/21  4163      History   Chief Complaint Chief Complaint  Patient presents with   Ankle Pain   Fall    HPI Jessica Hebert is a 21 y.o. female.   Patient presents with pain and swelling to the right ankle beginning 1 day ago after fall.  Endorses that she is intoxicated when she tripped and fell down the stairs of her apartment complex.  Is able to bear weight but elicits severe pain.  Range of motion is intact but pain is elicited with flexion of the foot.  Has not attempted treatment.  Denies numbness or tingling.  Past Medical History:  Diagnosis Date   Acne    Anxiety    Asthma    Depression    Eating disorder, unspecified    Bulimia   Eczema    GERD (gastroesophageal reflux disease)    Headache    sinus related    Patient Active Problem List   Diagnosis Date Noted   ADHD (attention deficit hyperactivity disorder) 07/24/2019   Allergic rhinitis due to allergen 07/24/2019   Overweight 07/24/2019   Intractable migraine without aura and without status migrainosus 05/09/2018   Acne 07/19/2016   Asthma 07/19/2016   Anxiety and depression 07/19/2016    Past Surgical History:  Procedure Laterality Date   ENDOSCOPIC CONCHA BULLOSA RESECTION Left 02/24/2018   Procedure: ENDOSCOPIC CONCHA BULLOSA REDUCTION VIA NASAL ENDOSCOPY;  Surgeon: Vernie Murders, MD;  Location: [facility];  Service: ENT;  Laterality: Left;   RHINOPLASTY  02/24/2018   septoplasty    SEPTOPLASTY Bilateral 02/24/2018   Procedure: SEPTOPLASTY;  Surgeon: Vernie Murders, MD;  Location: [facility];  Service: ENT;  Laterality: Bilateral;   SEPTOPLASTY Bilateral    TONSILLECTOMY     TONSILLECTOMY AND ADENOIDECTOMY N/A 05/27/2017   Procedure: TONSILLECTOMY AND ADENOIDECTOMY;  Surgeon: Vernie Murders, MD;  Location: [facility];  Service: ENT;  Laterality: N/A;   WISDOM TOOTH EXTRACTION  10/22/2017   four     OB History     Gravida  0   Para  0   Term  0   Preterm  0   AB  0   Living  0      SAB  0   IAB  0   Ectopic  0   Multiple  0   Live Births  0            Home Medications    Prior to Admission medications   Medication Sig Start Date End Date Taking? Authorizing Provider  cetirizine (ZYRTEC ALLERGY) 10 MG tablet Take 1 tablet (10 mg total) by mouth daily. 08/26/20 10/25/21 Yes Domenick Gong, MD  escitalopram (LEXAPRO) 20 MG tablet Take 20 mg by mouth daily. 09/29/21  Yes [provider]  etonogestrel-ethinyl estradiol (NUVARING) 0.12-0.015 MG/24HR vaginal ring INSERT 1 RING IN THE VAGINA AND LEAVE IN PLACE FOR 3 CONSECUTIVE WEEKS, THEN REMOVE FOR 1 WEEK AS DIRECTED. 11/08/18  Yes Farrel Conners, CNM  fluticasone (FLONASE) 50 MCG/ACT nasal spray Place 2 sprays into both nostrils daily. 01/28/21  Yes Eusebio Friendly B, PA-C  montelukast (SINGULAIR) 10 MG tablet Take 10 mg by mouth at bedtime.   Yes [provider]  albuterol (PROVENTIL HFA;VENTOLIN HFA) 108 (90 Base) MCG/ACT inhaler Inhale 2 puffs into the lungs every 4 (four) hours as needed for wheezing or shortness of breath. 09/01/17  Domenick Gong, MD  EPINEPHrine 0.3 mg/0.3 mL IJ SOAJ injection U UTD 03/25/18   [provider]  ibuprofen (ADVIL) 600 MG tablet Take 1 tablet (600 mg total) by mouth every 6 (six) hours as needed. 04/30/20   Domenick Gong, MD  levonorgestrel-ethinyl estradiol (SEASONALE) 0.15-0.03 MG tablet Take 1 tablet by mouth daily. 03/12/20   [provider]  lidocaine (XYLOCAINE) 2 % solution Use as directed 15 mLs in the mouth or throat every 6 (six) hours as needed for mouth pain. 09/30/21   Becky Augusta, NP  LORazepam (ATIVAN) 0.5 MG tablet Take 1 tablet by mouth 2 (two) times daily. 09/28/18   [provider]  triamcinolone (NASACORT) 55 MCG/ACT AERO nasal inhaler Place into the nose.  10/03/18  [provider]    Family  History Family History  Problem Relation Age of Onset   Breast cancer Maternal Grandmother 53   Hypertension Maternal Grandmother    Heart disease Maternal Grandmother    Stroke Maternal Grandmother 74   Depression Mother    Lung cancer Maternal Uncle    Drug abuse Father     Social History Social History   Tobacco Use   Smoking status: Never   Smokeless tobacco: Never  Vaping Use   Vaping Use: Some days  Substance Use Topics   Alcohol use: Yes    Comment: social   Drug use: No     Allergies   Grapeseed extract [nutritional supplements], Other, and Pollen extract   Review of Systems Review of Systems  Constitutional: Negative.   Respiratory: Negative.    Cardiovascular: Negative.   Musculoskeletal:  Positive for gait problem, joint swelling and myalgias. Negative for arthralgias, back pain, neck pain and neck stiffness.  Skin: Negative.      Physical Exam Triage Vital Signs ED Triage Vitals  Enc Vitals Group     BP 10/25/21 0951 123/81     Pulse Rate 10/25/21 0951 93     Resp 10/25/21 0951 14     Temp 10/25/21 0951 97.6 F (36.4 C)     Temp Source 10/25/21 0951 Oral     SpO2 10/25/21 0951 98 %     Weight 10/25/21 0948 185 lb (83.9 kg)     Height 10/25/21 0948 5' 9.5" (1.765 m)     Head Circumference --      Peak Flow --      Pain Score 10/25/21 0947 7     Pain Loc --      Pain Edu? --      Excl. in GC? --    No data found.  Updated Vital Signs BP 123/81 (BP Location: Right Arm)   Pulse 93   Temp 97.6 F (36.4 C) (Oral)   Resp 14   Ht 5' 9.5" (1.765 m)   Wt 185 lb (83.9 kg)   LMP 10/04/2021 (Approximate) Comment: Has a Nuvaring  SpO2 98%   BMI 26.93 kg/m   Visual Acuity Right Eye Distance:   Left Eye Distance:   Bilateral Distance:    Right Eye Near:   Left Eye Near:    Bilateral Near:     Physical Exam Constitutional:      Appearance: Normal appearance.  HENT:     Head: Normocephalic.  Eyes:     Extraocular Movements:  Extraocular movements intact.  Pulmonary:     Effort: Pulmonary effort is normal.  Feet:     Comments: Has mild ecchymosis along the medial aspect of the  midfoot, tenderness is present over the base of the fifth metacarpal and the lateral malleolus, moderate to severe swelling is present throughout the ankle and the midfoot, difficulty bearing weight, range of motion intact but elicits pain with flexion and extension, sensation is intact, capillary refill less than 3 Neurological:     Mental Status: She is alert and oriented to person, place, and time. Mental status is at baseline.  Psychiatric:        Mood and Affect: Mood normal.        Behavior: Behavior normal.      UC Treatments / Results  Labs (all labs ordered are listed, but only abnormal results are displayed) Labs Reviewed - No data to display  EKG   Radiology No results found.  Procedures Procedures (including critical care time)  Medications Ordered in UC Medications - No data to display  Initial Impression / Assessment and Plan / UC Course  I have reviewed the triage vital signs and the nursing notes.  Pertinent labs & imaging results that were available during my care of the patient were reviewed by me and considered in my medical decision making (see chart for details).  Closed fracture of the distal end of the right fibula, initial encounter, closed fracture of the base of the fifth metatarsal bone of right foot, initial encounter  Confirmed via x-ray, discussed findings with patient, cam boot applied by nursing staff, neurovascularly intact prior to and after placement, recommended wearing when standing and walking, minimal relief at rest, may use ice or heat over the affected area, elevation for additional supportive measures, prescribed meloxicam for daily use for 7 days then as needed, may use over-the-counter Tylenol for additional supportive care, given walker referral to orthopedics for follow-up in 1 to 2  weeks for further evaluation and management Final Clinical Impressions(s) / UC Diagnoses   Final diagnoses:  None   Discharge Instructions   None    ED Prescriptions   None    PDMP not reviewed this encounter.   Valinda Hoar, NP 10/25/21 1041

## 2021-10-25 NOTE — Discharge Instructions (Signed)
Your x-ray today showed a fracture ( break in bone) of your ankle and foot  You have been placed in a boot.  This is used to protect your injury and prevent further damage.  Wear whenever standing and walking to add stability and support, may remove when resting  Take meloxicam every morning for 7 days, this is to help reduce inflammation and in turn will help with your pain, you may use Tylenol 500 to 1000 mg every 6 hours throughout the day for additional comfort  You may use ice or heat over your ankle and foot in 10 to 15-minute intervals to help reduce swelling and for comfort  While resting you may elevate your foot to help reduce swelling  Follow up with orthopedic specialist in 1-2 weeks. Call practice to make appointment. Information listed below. You may go to any orthopedic provider you deem fit.

## 2022-01-22 ENCOUNTER — Ambulatory Visit
Admission: EM | Admit: 2022-01-22 | Discharge: 2022-01-22 | Disposition: A | Discharge status: Home / Self Care | Payer: Medicaid Other

## 2022-01-22 ENCOUNTER — Encounter: Payer: Self-pay | Admitting: Emergency Medicine

## 2022-01-22 DIAGNOSIS — H9201 Otalgia, right ear: Secondary | ICD-10-CM

## 2022-01-22 DIAGNOSIS — J069 Acute upper respiratory infection, unspecified: Secondary | ICD-10-CM

## 2022-01-22 DIAGNOSIS — H6503 Acute serous otitis media, bilateral: Secondary | ICD-10-CM

## 2022-01-22 NOTE — Discharge Instructions (Signed)
-  You have ear pain because you have fluid behind your eardrums.  Switch to Mucinex D or Zyrtec-D.  Asked the pharmacy staff for this medication. - Start using Flonase daily and continue using nasal saline.  Ibuprofen and Tylenol as needed for discomfort, warm compresses.  As we discussed your symptoms should be getting better over the next 1 to 2 weeks. - If you start to have a fever or worsening symptoms, please return for reevaluation.

## 2022-01-22 NOTE — ED Provider Notes (Signed)
[facility]    CSN: [id number] Arrival date & time: 01/22/22  0932      History   Chief Complaint Chief Complaint  Patient presents with   Otalgia    HPI Jessica Hebert is a 21 y.o. female presenting for 2-day history of right-sided ear pain and fullness.  She also reports sometimes she feels pain in the left ear.  Also reporting 3 to 4-day history of nasal congestion, cough, headaches. She denies known COVID or flu exposure.  Denies any fevers.  Does not report any sinus pain, chest pain, breathing difficulty, nausea/vomiting.  Has tried Mucinex and saline mist.  No other complaints.  HPI  Past Medical History:  Diagnosis Date   Acne    Anxiety    Asthma    Depression    Eating disorder, unspecified    Bulimia   Eczema    GERD (gastroesophageal reflux disease)    Headache    sinus related    Patient Active Problem List   Diagnosis Date Noted   ADHD (attention deficit hyperactivity disorder) 07/24/2019   Allergic rhinitis due to allergen 07/24/2019   Overweight 07/24/2019   Intractable migraine without aura and without status migrainosus 05/09/2018   Acne 07/19/2016   Asthma 07/19/2016   Anxiety and depression 07/19/2016    Past Surgical History:  Procedure Laterality Date   ENDOSCOPIC CONCHA BULLOSA RESECTION Left 02/24/2018   Procedure: ENDOSCOPIC CONCHA BULLOSA REDUCTION VIA NASAL ENDOSCOPY;  Surgeon: Vernie Murders, MD;  Location: [facility];  Service: ENT;  Laterality: Left;   RHINOPLASTY  02/24/2018   septoplasty    SEPTOPLASTY Bilateral 02/24/2018   Procedure: SEPTOPLASTY;  Surgeon: Vernie Murders, MD;  Location: [facility];  Service: ENT;  Laterality: Bilateral;   SEPTOPLASTY Bilateral    TONSILLECTOMY     TONSILLECTOMY AND ADENOIDECTOMY N/A 05/27/2017   Procedure: TONSILLECTOMY AND ADENOIDECTOMY;  Surgeon: Vernie Murders, MD;  Location: [facility];  Service: ENT;  Laterality: N/A;   WISDOM TOOTH EXTRACTION   10/22/2017   four    OB History     Gravida  0   Para  0   Term  0   Preterm  0   AB  0   Living  0      SAB  0   IAB  0   Ectopic  0   Multiple  0   Live Births  0            Home Medications    Prior to Admission medications   Medication Sig Start Date End Date Taking? Authorizing Provider  albuterol (PROVENTIL HFA;VENTOLIN HFA) 108 (90 Base) MCG/ACT inhaler Inhale 2 puffs into the lungs every 4 (four) hours as needed for wheezing or shortness of breath. 09/01/17  Yes Domenick Gong, MD  cetirizine (ZYRTEC ALLERGY) 10 MG tablet Take 1 tablet (10 mg total) by mouth daily. 08/26/20 01/22/22 Yes Domenick Gong, MD  EPINEPHrine 0.3 mg/0.3 mL IJ SOAJ injection U UTD 03/25/18  Yes [provider]  escitalopram (LEXAPRO) 20 MG tablet Take 20 mg by mouth daily. 09/29/21  Yes [provider]  etonogestrel-ethinyl estradiol (NUVARING) 0.12-0.015 MG/24HR vaginal ring INSERT 1 RING IN THE VAGINA AND LEAVE IN PLACE FOR 3 CONSECUTIVE WEEKS, THEN REMOVE FOR 1 WEEK AS DIRECTED. 11/08/18  Yes Farrel Conners, CNM  fluticasone (FLONASE) 50 MCG/ACT nasal spray Place 2 sprays into both nostrils daily. 01/28/21   Eusebio Friendly B, PA-C  ibuprofen (ADVIL) 600  MG tablet Take 1 tablet (600 mg total) by mouth every 6 (six) hours as needed. 04/30/20   Domenick Gong, MD  lidocaine (XYLOCAINE) 2 % solution Use as directed 15 mLs in the mouth or throat every 6 (six) hours as needed for mouth pain. 09/30/21   Becky Augusta, NP  meloxicam (MOBIC) 7.5 MG tablet Take 1 tablet (7.5 mg total) by mouth daily. 10/25/21   White, Elita Boone, NP  montelukast (SINGULAIR) 10 MG tablet Take 10 mg by mouth at bedtime.    [provider]  triamcinolone (NASACORT) 55 MCG/ACT AERO nasal inhaler Place into the nose.  10/03/18  [provider]    Family History Family History  Problem Relation Age of Onset   Breast cancer Maternal Grandmother 53   Hypertension Maternal  Grandmother    Heart disease Maternal Grandmother    Stroke Maternal Grandmother 16   Depression Mother    Lung cancer Maternal Uncle    Drug abuse Father     Social History Social History   Tobacco Use   Smoking status: Some Days    Types: Cigarettes   Smokeless tobacco: Never  Vaping Use   Vaping Use: Never used  Substance Use Topics   Alcohol use: Yes    Comment: social   Drug use: No     Allergies   Grapeseed extract [nutritional supplements], Other, and Pollen extract   Review of Systems Review of Systems  Constitutional:  Negative for chills, diaphoresis, fatigue and fever.  HENT:  Positive for congestion, ear pain, hearing loss, postnasal drip and rhinorrhea. Negative for ear discharge, sinus pressure, sinus pain and sore throat.   Respiratory:  Positive for cough. Negative for shortness of breath.   Cardiovascular:  Negative for chest pain.  Gastrointestinal:  Negative for abdominal pain, diarrhea, nausea and vomiting.  Musculoskeletal:  Negative for arthralgias and myalgias.  Skin:  Negative for rash.  Neurological:  Positive for headaches. Negative for dizziness and weakness.  Hematological:  Negative for adenopathy.     Physical Exam Triage Vital Signs ED Triage Vitals  Enc Vitals Group     BP 01/28/21 1003 107/71     Pulse Rate 01/28/21 1003 79     Resp 01/28/21 1003 16     Temp 01/28/21 1003 98.1 F (36.7 C)     Temp Source 01/28/21 1003 Oral     SpO2 01/28/21 1003 100 %     Weight 01/28/21 1001 199 lb 15.3 oz (90.7 kg)     Height 01/28/21 1001 5' 9.5" (1.765 m)     Head Circumference --      Peak Flow --      Pain Score 01/28/21 1001 0     Pain Loc --      Pain Edu? --      Excl. in GC? --    No data found.  Updated Vital Signs BP 127/80 (BP Location: Right Arm)   Pulse 79   Temp 97.8 F (36.6 C) (Oral)   Resp 18   LMP 01/08/2022   SpO2 96%     Physical Exam Vitals and nursing note reviewed.  Constitutional:      General: She  is not in acute distress.    Appearance: Normal appearance. She is not ill-appearing or toxic-appearing.  HENT:     Head: Normocephalic and atraumatic.     Right Ear: Hearing, ear canal and external ear normal. A middle ear effusion is present.     Left  Ear: Hearing, ear canal and external ear normal. A middle ear effusion is present.     Nose: Congestion present.     Mouth/Throat:     Mouth: Mucous membranes are moist.     Pharynx: Oropharynx is clear. Posterior oropharyngeal erythema (mild with clear PND) present.  Eyes:     General: No scleral icterus.       Right eye: No discharge.        Left eye: No discharge.     Conjunctiva/sclera: Conjunctivae normal.  Cardiovascular:     Rate and Rhythm: Normal rate and regular rhythm.     Heart sounds: Normal heart sounds.  Pulmonary:     Effort: Pulmonary effort is normal. No respiratory distress.     Breath sounds: Normal breath sounds.  Musculoskeletal:     Cervical back: Neck supple.  Skin:    General: Skin is dry.  Neurological:     General: No focal deficit present.     Mental Status: She is alert. Mental status is at baseline.     Motor: No weakness.     Gait: Gait normal.  Psychiatric:        Mood and Affect: Mood normal.        Behavior: Behavior normal.        Thought Content: Thought content normal.      UC Treatments / Results  Labs (all labs ordered are listed, but only abnormal results are displayed) Labs Reviewed - No data to display   EKG   Radiology No results found.  Procedures Procedures (including critical care time)  Medications Ordered in UC Medications - No data to display  Initial Impression / Assessment and Plan / UC Course  I have reviewed the triage vital signs and the nursing notes.  Pertinent labs & imaging results that were available during my care of the patient were reviewed by me and considered in my medical decision making (see chart for details).   21 year old female presenting  for 2 day history of right-sided ear pain and fullness and occasional left-sided ear pain and fullness.  Pain is intermittent.  Also reporting 3-4-day history of cough and congestion.  No associated fevers.  Advised patient that based on her clinical presentation, her symptoms are consistent with viral URI and otitis media with effusion.  Supportive care encouraged with increasing rest and fluids.  Advised switching to Mucinex D or Zyrtec-D as well as starting Flonase to help dry up the fluid.  Discussed that symptoms can be persistent for a couple weeks sometimes.  Reviewed return and ED precautions and provided with a work note.   Final Clinical Impressions(s) / UC Diagnoses   Final diagnoses:  Otalgia of right ear  Bilateral acute serous otitis media, recurrence not specified  Viral URI     Discharge Instructions      -You have ear pain because you have fluid behind your eardrums.  Switch to Mucinex D or Zyrtec-D.  Asked the pharmacy staff for this medication. - Start using Flonase daily and continue using nasal saline.  Ibuprofen and Tylenol as needed for discomfort, warm compresses.  As we discussed your symptoms should be getting better over the next 1 to 2 weeks. - If you start to have a fever or worsening symptoms, please return for reevaluation.    ED Prescriptions   None    PDMP not reviewed this encounter.     Shirlee Latch, PA-C 01/22/22 [phone]

## 2022-01-22 NOTE — ED Triage Notes (Signed)
Pt c/o right ear pain x 2 days 

## 2022-03-23 ENCOUNTER — Emergency Department
Admission: EM | Admit: 2022-03-23 | Discharge: 2022-03-24 | Disposition: A | Discharge status: Other Acute Inpatient Hospital | Payer: Medicaid Other | Attending: Emergency Medicine | Admitting: Emergency Medicine

## 2022-03-23 DIAGNOSIS — Z20822 Contact with and (suspected) exposure to covid-19: Secondary | ICD-10-CM | POA: Diagnosis not present

## 2022-03-23 DIAGNOSIS — T424X2A Poisoning by benzodiazepines, intentional self-harm, initial encounter: Secondary | ICD-10-CM | POA: Insufficient documentation

## 2022-03-23 DIAGNOSIS — X838XXA Intentional self-harm by other specified means, initial encounter: Secondary | ICD-10-CM | POA: Diagnosis not present

## 2022-03-23 DIAGNOSIS — F29 Unspecified psychosis not due to a substance or known physiological condition: Secondary | ICD-10-CM | POA: Insufficient documentation

## 2022-03-23 DIAGNOSIS — F418 Other specified anxiety disorders: Secondary | ICD-10-CM | POA: Diagnosis not present

## 2022-03-23 DIAGNOSIS — Z9189 Other specified personal risk factors, not elsewhere classified: Secondary | ICD-10-CM

## 2022-03-23 DIAGNOSIS — T50902A Poisoning by unspecified drugs, medicaments and biological substances, intentional self-harm, initial encounter: Secondary | ICD-10-CM | POA: Diagnosis present

## 2022-03-23 DIAGNOSIS — F419 Anxiety disorder, unspecified: Secondary | ICD-10-CM | POA: Diagnosis present

## 2022-03-23 LAB — URINE DRUG SCREEN, QUALITATIVE (ARMC ONLY)
Amphetamines, Ur Screen: NOT DETECTED
Amphetamines, Ur Screen: NOT DETECTED
Barbiturates, Ur Screen: NOT DETECTED
Barbiturates, Ur Screen: NOT DETECTED
Benzodiazepine, Ur Scrn: NOT DETECTED
Benzodiazepine, Ur Scrn: NOT DETECTED
Cannabinoid 50 Ng, Ur ~~LOC~~: NOT DETECTED
Cannabinoid 50 Ng, Ur ~~LOC~~: NOT DETECTED
Cocaine Metabolite,Ur ~~LOC~~: NOT DETECTED
Cocaine Metabolite,Ur ~~LOC~~: NOT DETECTED
MDMA (Ecstasy)Ur Screen: NOT DETECTED
MDMA (Ecstasy)Ur Screen: NOT DETECTED
Methadone Scn, Ur: NOT DETECTED
Methadone Scn, Ur: NOT DETECTED
Opiate, Ur Screen: NOT DETECTED
Opiate, Ur Screen: NOT DETECTED
Phencyclidine (PCP) Ur S: NOT DETECTED
Phencyclidine (PCP) Ur S: NOT DETECTED
Tricyclic, Ur Screen: NOT DETECTED
Tricyclic, Ur Screen: NOT DETECTED

## 2022-03-23 LAB — COMPREHENSIVE METABOLIC PANEL
ALT: 18 U/L (ref 0–44)
AST: 24 U/L (ref 15–41)
Albumin: 3.8 g/dL (ref 3.5–5.0)
Alkaline Phosphatase: 46 U/L (ref 38–126)
Anion gap: 9 (ref 5–15)
BUN: 12 mg/dL (ref 6–20)
CO2: 19 mmol/L — ABNORMAL LOW (ref 22–32)
Calcium: 8.7 mg/dL — ABNORMAL LOW (ref 8.9–10.3)
Chloride: 108 mmol/L (ref 98–111)
Creatinine, Ser: 0.82 mg/dL (ref 0.44–1.00)
GFR, Estimated: >60 mL/min (ref >60)
Glucose, Bld: 105 mg/dL — ABNORMAL HIGH (ref 70–99)
Potassium: 3.9 mmol/L (ref 3.5–5.1)
Sodium: 136 mmol/L (ref 135–145)
Total Bilirubin: 0.9 mg/dL (ref 0.3–1.2)
Total Protein: 7.9 g/dL (ref 6.5–8.1)

## 2022-03-23 LAB — BASIC METABOLIC PANEL
Anion gap: 7 (ref 5–15)
BUN: 13 mg/dL (ref 6–20)
CO2: 24 mmol/L (ref 22–32)
Calcium: 9 mg/dL (ref 8.9–10.3)
Chloride: 107 mmol/L (ref 98–111)
Creatinine, Ser: 0.86 mg/dL (ref 0.44–1.00)
GFR, Estimated: >60 mL/min (ref >60)
Glucose, Bld: 107 mg/dL — ABNORMAL HIGH (ref 70–99)
Potassium: 3.9 mmol/L (ref 3.5–5.1)
Sodium: 138 mmol/L (ref 135–145)

## 2022-03-23 LAB — CBC
HCT: 44.8 % (ref 36.0–46.0)
Hemoglobin: 15.4 g/dL — ABNORMAL HIGH (ref 12.0–15.0)
MCH: 30.5 pg (ref 26.0–34.0)
MCHC: 34.4 g/dL (ref 30.0–36.0)
MCV: 88.7 fL (ref 80.0–100.0)
Platelets: 216 10*3/uL (ref 150–400)
RBC: 5.05 MIL/uL (ref 3.87–5.11)
RDW: 12.4 % (ref 11.5–15.5)
WBC: 7.5 10*3/uL (ref 4.0–10.5)
nRBC: 0 % (ref 0.0–0.2)

## 2022-03-23 LAB — RESP PANEL BY RT-PCR (RSV, FLU A&B, COVID)  RVPGX2
Influenza A by PCR: NEGATIVE
Influenza B by PCR: NEGATIVE
Resp Syncytial Virus by PCR: NEGATIVE
SARS Coronavirus 2 by RT PCR: NEGATIVE

## 2022-03-23 LAB — SALICYLATE LEVEL: Salicylate Lvl: <7 mg/dL — ABNORMAL LOW (ref 7.0–30.0)

## 2022-03-23 LAB — ACETAMINOPHEN LEVEL
Acetaminophen (Tylenol), Serum: <10 ug/mL — ABNORMAL LOW (ref 10–30)
Acetaminophen (Tylenol), Serum: <10 ug/mL — ABNORMAL LOW (ref 10–30)

## 2022-03-23 LAB — POC URINE PREG, ED: Preg Test, Ur: NEGATIVE

## 2022-03-23 LAB — ETHANOL: Alcohol, Ethyl (B): <10 mg/dL (ref <10)

## 2022-03-23 MED ORDER — HYDROXYZINE HCL 25 MG PO TABS
25.0000 mg | ORAL_TABLET | Freq: Three times a day (TID) | ORAL | Status: DC | PRN
  Administered 2022-03-23: 25 mg via ORAL
  Filled 2022-03-23: qty 1

## 2022-03-23 MED ORDER — ESCITALOPRAM OXALATE 10 MG PO TABS
20.0000 mg | ORAL_TABLET | Freq: Every day | ORAL | Status: DC
  Administered 2022-03-24: 20 mg via ORAL
  Filled 2022-03-23: qty 2

## 2022-03-23 MED ORDER — MELATONIN 5 MG PO TABS
2.5000 mg | ORAL_TABLET | Freq: Every day | ORAL | Status: DC
  Administered 2022-03-23: 2.5 mg via ORAL
  Filled 2022-03-23: qty 1

## 2022-03-23 NOTE — ED Provider Notes (Signed)
The patient has been placed in psychiatric observation due to the need to provide a safe environment for the patient while obtaining psychiatric consultation and evaluation, as well as ongoing medical and medication management to treat the patient's condition.  The patient has been placed under full IVC at this time.    Jessica Kitten, MD 03/23/22 1550

## 2022-03-23 NOTE — ED Notes (Signed)
Spoke with Ebony Hail from poison control:  Tylenol Level EKG 6hr Obs No reversal drugs (Can send in withdrawals)

## 2022-03-23 NOTE — Consult Note (Addendum)
[facility] Face-to-Face Psychiatry Consult   Reason for Consult:  intentional overdose of prescribed clonazepam Referring Physician:  Quale Patient Identification: Jessica Hebert MRN:  [id number] Principal Diagnosis: Intentional overdose [facility]) Diagnosis:  Principal Problem:   Intentional overdose (HCC) Active Problems:   Anxiety and depression   Total Time spent with patient: 45 minutes  Subjective:  "I was feeling stressed out and I had an argument with my mom. I took 10 clonazepam." Jessica Hebert is a 22 y.o. female patient admitted with intentional overdose.  HPI:  Patient presents to the ED via EMS after an intentional overdose in the context of an argument with her mother, with whom she lives. Patient admits that she took the pills in an attempt to kill herself. Patient has a history of anxiety, depression and self-reported ADHD.  On evaluation, patient has an  inappropriate affect. She is alert and oriented x 4. Speaks in coherent sentences. Her voice is loud, and she laughs loudly at inappropriate times; this may be an indication of anxiety. She is also intermittently tearful, especially when she talks about her grandmother who is quite disabled from the effects of a stroke. Patient lives with her mother and grandmother. Patient says that she is dealing with a lot of stressors, but is not specific about them, or about what she and her mother were arguing about. Reports that she and mom "do not have a great relationship" but  she also does not like being away from mom.  Patient works full-time in Medical laboratory scientific officer health care."  Patient denies current thought or intent of suicide, saying that she regrets taking the overdose. She admits that she wanted to die when she took it. Denies homicidal thoughts, auditory or visual hallucinations. Perceptions appear to be intact.  Patient reports only occasional alcohol use, none recently,  and denies illicit drug use. Appetite is "up and down." Sleep is wakeful-able  to go to sleep with melatonin, but wakes several times during the night.   It is noted that UDS is negative for any substances, including benzos. Patient states "I promise I took them." Will repeat UDS. Bal <10  Patient has psychiatrist, Williams Che, who manages her medications: Clonazepam, 0.5 mg for anxiety (last rx per PDMP 07/02/21 #30); escitalopram 20 mg daily. She does not see a therapist, but has  an appointment with one in a couple weeks.   Attempted to reach patient's mother Jessica Hebert) for collateral. Left HIPAA compliant message at number patient reported to reach her, [phone]  Past Psychiatric History: Anxiety, depression, ADHD. Denies previous suicide attempts or hospitalizations. No meds for ADHD  Risk to Self:   Risk to Others:   Prior Inpatient Therapy:   Prior Outpatient Therapy:    Past Medical History:  Past Medical History:  Diagnosis Date   Acne    Anxiety    Asthma    Depression    Eating disorder, unspecified    Bulimia   Eczema    GERD (gastroesophageal reflux disease)    Headache    sinus related    Past Surgical History:  Procedure Laterality Date   ENDOSCOPIC CONCHA BULLOSA RESECTION Left 02/24/2018   Procedure: ENDOSCOPIC CONCHA BULLOSA REDUCTION VIA NASAL ENDOSCOPY;  Surgeon: Vernie Murders, MD;  Location: [facility];  Service: ENT;  Laterality: Left;   RHINOPLASTY  02/24/2018   septoplasty    SEPTOPLASTY Bilateral 02/24/2018   Procedure: SEPTOPLASTY;  Surgeon: Vernie Murders, MD;  Location: [facility];  Service: ENT;  Laterality: Bilateral;   SEPTOPLASTY Bilateral    TONSILLECTOMY     TONSILLECTOMY AND ADENOIDECTOMY N/A 05/27/2017   Procedure: TONSILLECTOMY AND ADENOIDECTOMY;  Surgeon: Margaretha Sheffield, MD;  Location: [place];  Service: ENT;  Laterality: N/A;   WISDOM TOOTH EXTRACTION  10/22/2017   four   Family History:  Family History  Problem Relation Age of Onset   Breast cancer Maternal Grandmother 53    Hypertension Maternal Grandmother    Heart disease Maternal Grandmother    Stroke Maternal Grandmother 28   Depression Mother    Lung cancer Maternal Uncle    Drug abuse Father    Family Psychiatric  History: Depression and anxiety both maternal and paternal side. Father has substance misuse problems.  Social History:  Social History   Substance and Sexual Activity  Alcohol Use Yes   Comment: social     Social History   Substance and Sexual Activity  Drug Use No    Social History   Socioeconomic History   Marital status: Single    Spouse name: Not on file   Number of children: 0   Years of education: 12   Highest education level: Not on file  Occupational History   Occupation: student  Tobacco Use   Smoking status: Some Days    Types: Cigarettes   Smokeless tobacco: Never  Vaping Use   Vaping Use: Never used  Substance and Sexual Activity   Alcohol use: Yes    Comment: social   Drug use: No   Sexual activity: Yes    Partners: Male    Birth control/protection: Other-see comments    Comment: Nuvaring   Other Topics Concern   Not on file  Social History Narrative   Not on file   Social Determinants of Health   Financial Resource Strain: Not on file  Food Insecurity: Not on file  Transportation Needs: Not on file  Physical Activity: Inactive (07/19/2017)   Exercise Vital Sign    Days of Exercise per Week: 0 days    Minutes of Exercise per Session: 0 min  Stress: No Stress Concern Present (07/19/2017)   [place]    Feeling of Stress : Not at all  Social Connections: Not on file   Additional Social History:    Allergies:   Allergies  Allergen Reactions   Grapeseed Extract [Nutritional Supplements] Shortness Of Breath    Grapes cause wheezing   Other     Cats- swelling eyes; sneezing   Pollen Extract Other (See Comments)    Hickory tree - sneezing    Labs:  Results for orders  placed or performed during the hospital encounter of 03/23/22 (from the past 48 hour(s))  Comprehensive metabolic panel     Status: Abnormal   Collection Time: 03/23/22  2:11 PM  Result Value Ref Range   Sodium 136 135 - 145 mmol/L   Potassium 3.9 3.5 - 5.1 mmol/L    Comment: HEMOLYSIS AT THIS LEVEL MAY AFFECT RESULT   Chloride 108 98 - 111 mmol/L   CO2 19 (L) 22 - 32 mmol/L   Glucose, Bld 105 (H) 70 - 99 mg/dL    Comment: Glucose reference range applies only to samples taken after fasting for at least 8 hours.   BUN 12 6 - 20 mg/dL   Creatinine, Ser 0.82 0.44 - 1.00 mg/dL   Calcium 8.7 (L) 8.9 - 10.3 mg/dL   Total Protein 7.9 6.5 - 8.1  g/dL   Albumin 3.8 3.5 - 5.0 g/dL   AST 24 15 - 41 U/L    Comment: HEMOLYSIS AT THIS LEVEL MAY AFFECT RESULT   ALT 18 0 - 44 U/L    Comment: HEMOLYSIS AT THIS LEVEL MAY AFFECT RESULT   Alkaline Phosphatase 46 38 - 126 U/L   Total Bilirubin 0.9 0.3 - 1.2 mg/dL    Comment: HEMOLYSIS AT THIS LEVEL MAY AFFECT RESULT   GFR, Estimated >60 >60 mL/min    Comment: (NOTE) Calculated using the CKD-EPI Creatinine Equation (2021)    Anion gap 9 5 - 15    Comment: Performed at [facility], [address] [place], [place] [address]  Ethanol     Status: None   Collection Time: 03/23/22  2:11 PM  Result Value Ref Range   Alcohol, Ethyl (B) <10 <10 mg/dL    Comment: (NOTE) Lowest detectable limit for serum alcohol is 10 mg/dL.  For medical purposes only. Performed at [facility], [address] [place], [place] [address]   Salicylate level     Status: Abnormal   Collection Time: 03/23/22  2:11 PM  Result Value Ref Range   Salicylate Lvl <9.8 (L) 7.0 - 30.0 mg/dL    Comment: Performed at [facility], [address] [place], [place] [address]  Acetaminophen level     Status: Abnormal   Collection Time: 03/23/22  2:11 PM  Result Value Ref Range   Acetaminophen (Tylenol), Serum <10 (L) 10 - 30 ug/mL    Comment:  (NOTE) Therapeutic concentrations vary significantly. A range of 10-30 ug/mL  may be an effective concentration for many patients. However, some  are best treated at concentrations outside of this range. Acetaminophen concentrations >150 ug/mL at 4 hours after ingestion  and >50 ug/mL at 12 hours after ingestion are often associated with  toxic reactions.  Performed at [facility], [address] [place], [place] [address]   cbc     Status: Abnormal   Collection Time: 03/23/22  2:11 PM  Result Value Ref Range   WBC 7.5 4.0 - 10.5 K/uL   RBC 5.05 3.87 - 5.11 MIL/uL   Hemoglobin 15.4 (H) 12.0 - 15.0 g/dL   HCT 44.8 36.0 - 46.0 %   MCV 88.7 80.0 - 100.0 fL   MCH 30.5 26.0 - 34.0 pg   MCHC 34.4 30.0 - 36.0 g/dL   RDW 12.4 11.5 - 15.5 %   Platelets 216 150 - 400 K/uL   nRBC 0.0 0.0 - 0.2 %    Comment: Performed at [facility], [address] [place], [place] [address]  Urine Drug Screen, Qualitative     Status: None   Collection Time: 03/23/22  2:11 PM  Result Value Ref Range   Tricyclic, Ur Screen NONE DETECTED NONE DETECTED   Amphetamines, Ur Screen NONE DETECTED NONE DETECTED   MDMA (Ecstasy)Ur Screen NONE DETECTED NONE DETECTED   Cocaine Metabolite,Ur [place] NONE DETECTED NONE DETECTED   Opiate, Ur Screen NONE DETECTED NONE DETECTED   Phencyclidine (PCP) Ur S NONE DETECTED NONE DETECTED   Cannabinoid 50 Ng, Ur [place] NONE DETECTED NONE DETECTED   Barbiturates, Ur Screen NONE DETECTED NONE DETECTED   Benzodiazepine, Ur Scrn NONE DETECTED NONE DETECTED   Methadone Scn, Ur NONE DETECTED NONE DETECTED    Comment: (NOTE) Tricyclics + metabolites, urine    Cutoff 1000 ng/mL Amphetamines + metabolites, urine  Cutoff 1000 ng/mL MDMA (Ecstasy), urine  Cutoff 500 ng/mL Cocaine Metabolite, urine          Cutoff 300 ng/mL Opiate + metabolites, urine        Cutoff 300 ng/mL Phencyclidine (PCP), urine         Cutoff 25 ng/mL Cannabinoid, urine                  Cutoff 50 ng/mL Barbiturates + metabolites, urine  Cutoff 200 ng/mL Benzodiazepine, urine              Cutoff 200 ng/mL Methadone, urine                   Cutoff 300 ng/mL  The urine drug screen provides only a preliminary, unconfirmed analytical test result and should not be used for non-medical purposes. Clinical consideration and professional judgment should be applied to any positive drug screen result due to possible interfering substances. A more specific alternate chemical method must be used in order to obtain a confirmed analytical result. Gas chromatography / mass spectrometry (GC/MS) is the preferred confirm atory method. Performed at [facility], [address] [place], [place] [address]   POC urine preg, ED     Status: None   Collection Time: 03/23/22  2:37 PM  Result Value Ref Range   Preg Test, Ur NEGATIVE NEGATIVE    Comment:        THE SENSITIVITY OF THIS METHODOLOGY IS >24 mIU/mL     No current facility-administered medications for this encounter.   Current Outpatient Medications  Medication Sig Dispense Refill   albuterol (PROVENTIL HFA;VENTOLIN HFA) 108 (90 Base) MCG/ACT inhaler Inhale 2 puffs into the lungs every 4 (four) hours as needed for wheezing or shortness of breath. 1 Inhaler 0   cetirizine (ZYRTEC ALLERGY) 10 MG tablet Take 1 tablet (10 mg total) by mouth daily. 30 tablet 0   EPINEPHrine 0.3 mg/0.3 mL IJ SOAJ injection U UTD     escitalopram (LEXAPRO) 20 MG tablet Take 20 mg by mouth daily.     etonogestrel-ethinyl estradiol (NUVARING) 0.12-0.015 MG/24HR vaginal ring INSERT 1 RING IN THE VAGINA AND LEAVE IN PLACE FOR 3 CONSECUTIVE WEEKS, THEN REMOVE FOR 1 WEEK AS DIRECTED. 3 each 3   fluticasone (FLONASE) 50 MCG/ACT nasal spray Place 2 sprays into both nostrils daily. 1 g 0   ibuprofen (ADVIL) 600 MG tablet Take 1 tablet (600 mg total) by mouth every 6 (six) hours as needed. 30 tablet 0   lidocaine (XYLOCAINE) 2 % solution Use as directed  15 mLs in the mouth or throat every 6 (six) hours as needed for mouth pain. 100 mL 0   meloxicam (MOBIC) 7.5 MG tablet Take 1 tablet (7.5 mg total) by mouth daily. 30 tablet 0   montelukast (SINGULAIR) 10 MG tablet Take 10 mg by mouth at bedtime.      Musculoskeletal: Strength & Muscle Tone: within normal limits Gait & Station: normal Patient leans: N/A    Psychiatric Specialty Exam:  Presentation  General Appearance: Casual  Eye Contact:Good  Speech:Clear and Coherent  Speech Volume:Increased  Handedness:Right   Mood and Affect  Mood:Dysphoric  Affect:Tearful; Inappropriate; Labile   Thought Process  Thought Processes:Coherent  Descriptions of Associations:Intact  Orientation:Full (Time, Place and Person)  Thought Content:Scattered  History of Schizophrenia/Schizoaffective disorder:No data recorded Duration of Psychotic Symptoms:No data recorded Hallucinations:Hallucinations: None  Ideas of Reference:None  Suicidal Thoughts:Suicidal Thoughts: No (Denies currently. Presented to ED d/t overdose)  Homicidal Thoughts:Homicidal Thoughts: No  Sensorium  Memory:Immediate Fair  Judgment:Poor  Insight:Poor   Executive Functions  Concentration:Fair  Attention Span:Fair  Recall:Fair  Fund of Knowledge:Fair  Language:Fair   Psychomotor Activity  Psychomotor Activity:Psychomotor Activity: Normal   Assets  Assets:Communication Skills; Desire for Improvement; Financial Resources/Insurance; Housing; Resilience; Social Support; Physical Health   Sleep  Sleep:Sleep: Fair   Physical Exam: Physical Exam Vitals and nursing note reviewed.  HENT:     Head: Normocephalic.     Nose: No congestion or rhinorrhea.  Eyes:     General:        Right eye: No discharge.        Left eye: No discharge.  Pulmonary:     Effort: Pulmonary effort is normal.  Musculoskeletal:        General: Normal range of motion.     Cervical back: Normal range of motion.   Skin:    General: Skin is dry.  Neurological:     Mental Status: She is alert and oriented to person, place, and time.  Psychiatric:        Attention and Perception: Attention normal.        Mood and Affect: Affect is inappropriate.        Speech: Speech normal.        Behavior: Behavior is cooperative.        Thought Content: Thought content includes suicidal (at  time of ingestion; denies at this time) ideation.        Cognition and Memory: Cognition normal.        Judgment: Judgment is impulsive.    Review of Systems  Constitutional: Negative.   HENT: Negative.    Respiratory: Negative.    Skin: Negative.   Psychiatric/Behavioral:  Positive for depression and suicidal ideas (denies currently. Here for OD). The patient is nervous/anxious.    Blood pressure (!) 143/74, pulse (!) 115, temperature 98.1 F (36.7 C), temperature source Oral, resp. rate 18, SpO2 97 %. There is no height or weight on file to calculate BMI.  Treatment Plan Summary: Plan Recommend inpatient psychiatric admission. Reviewed with Dr. Larinda Buttery  Disposition: Recommend psychiatric Inpatient admission when medically cleared. Supportive therapy provided about ongoing stressors.  Vanetta Mulders, NP 03/23/2022 5:09 PM

## 2022-03-23 NOTE — ED Provider Notes (Signed)
[facility] Provider Note    Event Date/Time   First MD Initiated Contact with Patient 03/23/22 1439     (approximate)   History   Suicide Attempt   HPI  Jessica Hebert is a 22 y.o. female reports a history of anxiety and depression  Used her single dose of Lexapro this morning as typical.  Then around 12:30 PM after she had had a discussion and high anxiety after talking to her mother she reports that she took approximately 39 of her own clonazepam tablets which are each half milligram.  She briefly felt sort of like an out of body feeling her that her body was not responding to her, but that is since resolved and she feels like she is in control of her body once again now.  EMS was called out  She reports this was a self-harm attempt, but she is now quite remorseful and regretful of the incident.  She reports she did not respond well on this the first time she is ever done this in response to what she describes as anxiety  No recent illness other than she was treated for chlamydia about a month ago but has had no further symptoms.  Denies pregnancy.  No headache no cough no fevers no chills.  Denies taking any other ingestant just took about 10 pills of her clonazepam at 12:30 PM     Physical Exam   Triage Vital Signs: ED Triage Vitals  Enc Vitals Group     BP 03/23/22 1355 (!) 143/74     Pulse Rate 03/23/22 1355 (!) 115     Resp 03/23/22 1355 18     Temp 03/23/22 1355 98.1 F (36.7 C)     Temp Source 03/23/22 1355 Oral     SpO2 03/23/22 1355 97 %     Weight --      Height --      Head Circumference --      Peak Flow --      Pain Score 03/23/22 1356 0     Pain Loc --      Pain Edu? --      Excl. in [place]? --     Most recent vital signs: Vitals:   03/23/22 1355  BP: (!) 143/74  Pulse: (!) 115  Resp: 18  Temp: 98.1 F (36.7 C)  SpO2: 97%     General: Awake, no distress.  CV:  Good peripheral perfusion.  Normal tones and  rate Resp:  Normal effort.  Clear bilaterally Abd:  No distention.  Other:  Normal mental status.  Fully alert and oriented.  Reports this was a self-harm attempt but she is now quite remorseful of it   ED Results / Procedures / Treatments   Labs (all labs ordered are listed, but only abnormal results are displayed) Labs Reviewed  COMPREHENSIVE METABOLIC PANEL - Abnormal; Notable for the following components:      Result Value   CO2 19 (*)    Glucose, Bld 105 (*)    Calcium 8.7 (*)    All other components within normal limits  SALICYLATE LEVEL - Abnormal; Notable for the following components:   Salicylate Lvl <2.5 (*)    All other components within normal limits  ACETAMINOPHEN LEVEL - Abnormal; Notable for the following components:   Acetaminophen (Tylenol), Serum <10 (*)    All other components within normal limits  CBC - Abnormal; Notable for the following components:   Hemoglobin 15.4 (*)  All other components within normal limits  ETHANOL  URINE DRUG SCREEN, QUALITATIVE ([facility] ONLY)  POC URINE PREG, ED  CBG MONITORING, ED     EKG  Interpreted by me at 2:15 PM Sinus tachycardia heart rate approximately 120.  No QRS widening or QT prolongation without terminal elevation of aVR   RADIOLOGY     PROCEDURES:  Critical Care performed: No  Procedures   MEDICATIONS ORDERED IN ED: Medications - No data to display   IMPRESSION / MDM / [place] / ED COURSE  I reviewed the triage vital signs and the nursing notes.                              Differential diagnosis includes, but is not limited to, self-harm attempt, anxiety or panic disorder, stress reaction, suicide attempt.  Does not appear to be in any acute distress or exhibit any obvious evidence of an acute toxidrome.  Denies use or ingestion of any salicylate or acetaminophen containing products.  Her alcohol salicylate and acetaminophen levels are negative.  Patient's presentation is most  consistent with acute complicated illness / injury requiring diagnostic workup.   Patient will be observed until approximately 7 PM per recommendations of poison control.  If she remains stable, normal mental status, and reassuring evaluation from there she will likely be able to be medically cleared.  She is also under IVC pending psychiatry consultation  Ongoing care with plan above assigned to Dr. Charna Archer  Both patient and her mother are understanding and agreeable with plan to proceed with psychiatry consult and further medical observation       FINAL CLINICAL IMPRESSION(S) / ED DIAGNOSES   Final diagnoses:  At risk for intentional self-harm     Rx / DC Orders   ED Discharge Orders     None        Note:  This document was prepared using Dragon voice recognition software and may include unintentional dictation errors.   Delman Kitten, MD 03/23/22 1549

## 2022-03-23 NOTE — ED Notes (Signed)
IVC/possible in-patient psych admit

## 2022-03-23 NOTE — ED Notes (Signed)
Pt denies needs, provided with water. Reports improvement of symptoms from medication. States that she is regretful of taking medication but understands process

## 2022-03-23 NOTE — ED Triage Notes (Signed)
First Nurse: Pt here with SI. Pt took 10 Clonazepam. Pt dealing with family issues at the moment. Took her Lexapro at 0930.  150/94 99% 125

## 2022-03-23 NOTE — ED Notes (Signed)
Pt given dinner tray and drink 

## 2022-03-23 NOTE — ED Notes (Signed)
Urine sent

## 2022-03-23 NOTE — ED Notes (Signed)
Patient and mother advised of intent to admit patient and had process of admission explained. Patient gives verbal consent to give medical information and updates to mother.

## 2022-03-23 NOTE — ED Notes (Signed)
Poison control called to get update on patient. Vitals, lab results, and EKG results conveyed to poison control. Recommendations per poison control were to repeat Tylenol level and BMP. Secure chat sent to Dr. Charna Archer to notify.

## 2022-03-23 NOTE — ED Notes (Signed)
[employer] Shark shirt  Pearline Cables sweater Pink/gray pants [employer]

## 2022-03-23 NOTE — BH Assessment (Signed)
Patient has been accepted to [facility].  Patient assigned to room 305-1 Accepting physician is Dr. Mamie Levers.  Call report to [phone].  Representative was Manus Rudd., AC.   ER Staff is aware of it:  Kathrin Greathouse, ER Secretary  Dr. Charna Archer, ER MD  Gerald Stabs, Patient's Nurse     Patient can arrive anytime after 8 AM 03/24/22.

## 2022-03-23 NOTE — ED Triage Notes (Signed)
Pt presents to the ED via [facility] due to SI. Pt states she took 10 Clonazepam after having a fight with her mom in attempt to kill herself/ Pt states she is really depressed. Pt A&Ox4

## 2022-03-23 NOTE — ED Notes (Signed)
Danielle with [employer] calls this nurse. Updated labs provided. Per [place] pt is medically cleared and her case is closed for further disposition. Dr. Charna Archer notified.

## 2022-03-23 NOTE — BH Assessment (Signed)
Comprehensive Clinical Assessment (CCA) Note  03/23/2022 Jessica Hebert [id number]  Edrick Oh, 22 year old female who presents to [facility] ED involuntarily for treatment. Per triage note, Pt presents to the ED via [facility] due to SI. Pt states she took 10 Clonazepam after having a fight with her mom in attempt to kill herself/ Pt states she is really depressed.   During TTS assessment pt presents alert and oriented x 4, restless but cooperative, and mood-congruent with affect. The pt does not appear to be responding to internal or external stimuli. Neither is the pt presenting with any delusional thinking. Pt verified the information provided to triage RN.   Pt identifies her main complaint as that she has been under a lot of stress but could not explain what was stressing her then. Patient reports having a strained relationship with her mom, and they got into an argument. Patient states at that moment, she had "reached her breaking point" and took several of her prescribed pills. Patient reports immediately after taking the pills she regretted her impulsive decision and sent text messages to her friends to alert them of what she had done. Patient lives with her mom and ill grandmother. Patient states that some stressors stem from grandmother's condition due to having a stroke. Patient reports she is very close with her grandmother. 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 "I love my job." Patient denies using any illicit substances and drinks occasionally. Patient reports no prior suicidal attempts. Pt reports having no previous INPT hx. Patient is being followed by Dr. Maryjane Hurter who manages her medications. Pt reports family hx of MH/SA. Pt denies current SI/HI/AH/VH. Patient was tearful when learning that she would be recommended for inpatient treatment. "I have never been away from my mom."      Per Barbaraann Share, NP, pt is recommended for inpatient psychiatric admission.   Chief Complaint:   Chief Complaint  Patient presents with   Suicide Attempt   Visit Diagnosis: Intentional overdose    CCA Screening, Triage and Referral (STR)  Patient Reported Information How did you hear about [place]? Family/Friend  Referral name: No data recorded Referral phone number: No data recorded  Whom do you see for routine medical problems? No data recorded Practice/Facility Name: No data recorded Practice/Facility Phone Number: No data recorded Name of Contact: No data recorded Contact Number: No data recorded Contact Fax Number: No data recorded Prescriber Name: No data recorded Prescriber Address (if known): No data recorded  What Is the Reason for Your Visit/Call Today? Patient was brought to the ED for intentional overdose.  How Long Has This Been Causing You Problems? > than 6 months  What Do You Feel Would Help You the Most Today? Treatment for Depression or other mood problem; Medication(s); Stress Management   Have You Recently Been in Any Inpatient Treatment (Hospital/Detox/Crisis Center/28-Day Program)? No data recorded Name/Location of Program/Hospital:No data recorded How Long Were You There? No data recorded When Were You Discharged? No data recorded  Have You Ever Received Services From [facility] Before? No data recorded Who Do You See at [facility]? No data recorded  Have You Recently Had Any Thoughts About Hurting Yourself? Yes  Are You Planning to Commit Suicide/Harm Yourself At This time? No   Have you Recently Had Thoughts About [place]? No  Explanation: No data recorded  Have You Used Any Alcohol or Drugs in the Past 24 Hours? No  How Long Ago Did You  Use Drugs or Alcohol? No data recorded What Did You Use and How Much? No data recorded  Do You Currently Have a Therapist/Psychiatrist? Yes  Name of Therapist/Psychiatrist: Dr. Maryjane Hurter   Have You Been Recently Discharged From Any Office Practice or Programs? No  Explanation of  Discharge From Practice/Program: No data recorded    CCA Screening Triage Referral Assessment Type of Contact: Face-to-Face  Is this Initial or Reassessment? No data recorded Date Telepsych consult ordered in [facility]:  No data recorded Time Telepsych consult ordered in [facility]:  No data recorded  Patient Reported Information Reviewed? No data recorded Patient Left Without Being Seen? No data recorded Reason for Not Completing Assessment: No data recorded  Collateral Involvement: None provided   Does Patient Have a [place]? No data recorded Name and Contact of Legal Guardian: No data recorded If Minor and Not Living with Parent(s), Who has Custody? No data recorded Is CPS involved or ever been involved? No data recorded Is APS involved or ever been involved? No data recorded  Patient Determined To Be At Risk for Harm To Self or Others Based on Review of Patient Reported Information or Presenting Complaint? No data recorded Method: No data recorded Availability of Means: No data recorded Intent: No data recorded Notification Required: No data recorded Additional Information for Danger to Others Potential: No data recorded Additional Comments for Danger to Others Potential: No data recorded Are There Guns or Other Weapons in Your Home? No data recorded Types of Guns/Weapons: No data recorded Are These Weapons Safely Secured?                            No data recorded Who Could Verify You Are Able To Have These Secured: No data recorded Do You Have any Outstanding Charges, Pending Court Dates, Parole/Probation? No data recorded Contacted To Inform of Risk of Harm To Self or Others: No data recorded  Location of Assessment: [facility] ED   Does Patient Present under Involuntary Commitment? Yes  IVC Papers Initial File Date: No data recorded  [place] of Residence: [place]   Patient Currently Receiving the Following Services: Medication Management   Determination  of Need: Emergent (2 hours)   Options For Referral: ED Visit; Inpatient Hospitalization; Medication Management; Outpatient Therapy     CCA Biopsychosocial Intake/Chief Complaint:  No data recorded Current Symptoms/Problems: No data recorded  Patient Reported Schizophrenia/Schizoaffective Diagnosis in Past: No   Strengths: Patient is able to communicate and verbalize needs.  Preferences: No data recorded Abilities: No data recorded  Type of Services Patient Feels are Needed: No data recorded  Initial Clinical Notes/Concerns: No data recorded  Mental Health Symptoms Depression:   Increase/decrease in appetite; Sleep (too much or little); Tearfulness; Irritability   Duration of Depressive symptoms:  Greater than two weeks   Mania:   N/A   Anxiety:    Difficulty concentrating; Sleep; Tension   Psychosis:   None   Duration of Psychotic symptoms: No data recorded  Trauma:   N/A   Obsessions:   N/A   Compulsions:   N/A   Inattention:   N/A   Hyperactivity/Impulsivity:   None   Oppositional/Defiant Behaviors:   N/A   Emotional Irregularity:   Potentially harmful impulsivity   Other Mood/Personality Symptoms:   Inappropriate laughing    Mental Status Exam Appearance and self-care  Stature:   Tall   Weight:   Overweight   Clothing:  Age-appropriate   Grooming:   Normal   Cosmetic use:   None   Posture/gait:   Normal   Motor activity:   Not Remarkable   Sensorium  Attention:   Normal   Concentration:   Normal   Orientation:   X5   Recall/memory:   Normal   Affect and Mood  Affect:   Anxious; Tearful; Depressed   Mood:   Anxious; Depressed   Relating  Eye contact:   Normal   Facial expression:   Anxious; Depressed; Fearful   Attitude toward examiner:   Cooperative   Thought and Language  Speech flow:  Clear and Coherent; Loud   Thought content:   Appropriate to Mood and Circumstances   Preoccupation:    None   Hallucinations:   None   Organization:  No data recorded  [employer] of Knowledge:   Average   Intelligence:   Average   Abstraction:   Normal   Judgement:   Impaired   Reality Testing:   Adequate   Insight:   Fair   Decision Making:   Impulsive   Social Functioning  Social Maturity:   Irresponsible; Impulsive   Social Judgement:   Naive   Stress  Stressors:   Family conflict; Illness; Relationship   Coping Ability:   Overwhelmed   Skill Deficits:   Decision making   Supports:   Family     Religion:    Leisure/Recreation:    Exercise/Diet: Exercise/Diet Do You Have Any Trouble Sleeping?: Yes Explanation of Sleeping Difficulties: Patient reports she falls asleep but wakes up.   CCA Employment/Education Employment/Work Situation: Employment / Work Situation Employment Situation: Employed Work Stressors: None  Education:     CCA Family/Childhood History Family and Relationship History: Family history Marital status: Single Does patient have children?: No  Childhood History:  Childhood History By whom was/is the patient raised?: Other (Comment) Database administrator)  Child/Adolescent Assessment:     CCA Substance Use Alcohol/Drug Use: Alcohol / Drug Use Pain Medications: See PTA Prescriptions: See PTA Over the Counter: See PTA History of alcohol / drug use?: No history of alcohol / drug abuse                         ASAM's:  Six Dimensions of Multidimensional Assessment  Dimension 1:  Acute Intoxication and/or Withdrawal Potential:      Dimension 2:  Biomedical Conditions and Complications:      Dimension 3:  Emotional, Behavioral, or Cognitive Conditions and Complications:     Dimension 4:  Readiness to Change:     Dimension 5:  Relapse, Continued use, or Continued Problem Potential:     Dimension 6:  Recovery/Living Environment:     ASAM Severity Score:    ASAM Recommended Level of  Treatment:     Substance use Disorder (SUD)    Recommendations for Services/Supports/Treatments:    DSM5 Diagnoses: Patient Active Problem List   Diagnosis Date Noted   Intentional overdose (HCC) 03/23/2022   ADHD (attention deficit hyperactivity disorder) 07/24/2019   Allergic rhinitis due to allergen 07/24/2019   Overweight 07/24/2019   Intractable migraine without aura and without status migrainosus 05/09/2018   Acne 07/19/2016   Asthma 07/19/2016   Anxiety and depression 07/19/2016    Patient Centered Plan: Patient is on the following Treatment Plan(s):  Anxiety and Depression   Referrals to Alternative Service(s): Referred to Alternative Service(s):   Place:   Date:   Time:  Referred to Alternative Service(s):   Place:   Date:   Time:    Referred to Alternative Service(s):   Place:   Date:   Time:    Referred to Alternative Service(s):   Place:   Date:   Time:      '@[facility]COLLABOFCARE'$ @  [place], Counselor, LCAS-A

## 2022-03-24 ENCOUNTER — Encounter (HOSPITAL_COMMUNITY): Payer: Self-pay | Admitting: Psychiatry

## 2022-03-24 ENCOUNTER — Inpatient Hospital Stay (HOSPITAL_COMMUNITY)
Admission: AD | Admit: 2022-03-24 | Discharge: 2022-03-29 | DRG: 885 | Disposition: A | Discharge status: Home / Self Care | Payer: Medicaid Other | Attending: Psychiatry | Admitting: Psychiatry

## 2022-03-24 ENCOUNTER — Other Ambulatory Visit: Payer: Self-pay

## 2022-03-24 DIAGNOSIS — Z9152 Personal history of nonsuicidal self-harm: Secondary | ICD-10-CM | POA: Diagnosis not present

## 2022-03-24 DIAGNOSIS — F5081 Binge eating disorder: Secondary | ICD-10-CM

## 2022-03-24 DIAGNOSIS — Z8659 Personal history of other mental and behavioral disorders: Secondary | ICD-10-CM

## 2022-03-24 DIAGNOSIS — T424X2A Poisoning by benzodiazepines, intentional self-harm, initial encounter: Secondary | ICD-10-CM | POA: Diagnosis present

## 2022-03-24 DIAGNOSIS — J45909 Unspecified asthma, uncomplicated: Secondary | ICD-10-CM | POA: Diagnosis present

## 2022-03-24 DIAGNOSIS — F909 Attention-deficit hyperactivity disorder, unspecified type: Secondary | ICD-10-CM | POA: Diagnosis present

## 2022-03-24 DIAGNOSIS — Z79899 Other long term (current) drug therapy: Secondary | ICD-10-CM | POA: Diagnosis not present

## 2022-03-24 DIAGNOSIS — F411 Generalized anxiety disorder: Secondary | ICD-10-CM | POA: Insufficient documentation

## 2022-03-24 DIAGNOSIS — G43019 Migraine without aura, intractable, without status migrainosus: Secondary | ICD-10-CM | POA: Diagnosis present

## 2022-03-24 DIAGNOSIS — Z23 Encounter for immunization: Secondary | ICD-10-CM

## 2022-03-24 DIAGNOSIS — T50902A Poisoning by unspecified drugs, medicaments and biological substances, intentional self-harm, initial encounter: Secondary | ICD-10-CM | POA: Diagnosis present

## 2022-03-24 DIAGNOSIS — Z9151 Personal history of suicidal behavior: Secondary | ICD-10-CM

## 2022-03-24 DIAGNOSIS — F410 Panic disorder [episodic paroxysmal anxiety] without agoraphobia: Secondary | ICD-10-CM | POA: Diagnosis present

## 2022-03-24 DIAGNOSIS — F502 Bulimia nervosa: Secondary | ICD-10-CM | POA: Diagnosis present

## 2022-03-24 DIAGNOSIS — Z818 Family history of other mental and behavioral disorders: Secondary | ICD-10-CM

## 2022-03-24 DIAGNOSIS — Z87891 Personal history of nicotine dependence: Secondary | ICD-10-CM

## 2022-03-24 DIAGNOSIS — Z823 Family history of stroke: Secondary | ICD-10-CM | POA: Diagnosis not present

## 2022-03-24 DIAGNOSIS — Z20822 Contact with and (suspected) exposure to covid-19: Secondary | ICD-10-CM | POA: Diagnosis present

## 2022-03-24 DIAGNOSIS — F332 Major depressive disorder, recurrent severe without psychotic features: Principal | ICD-10-CM | POA: Diagnosis present

## 2022-03-24 DIAGNOSIS — F329 Major depressive disorder, single episode, unspecified: Principal | ICD-10-CM | POA: Diagnosis present

## 2022-03-24 MED ORDER — LORATADINE 10 MG PO TABS
10.0000 mg | ORAL_TABLET | Freq: Every day | ORAL | Status: DC
  Administered 2022-03-24: 10 mg via ORAL
  Filled 2022-03-24: qty 1

## 2022-03-24 MED ORDER — ALBUTEROL SULFATE (2.5 MG/3ML) 0.083% IN NEBU: 2.5000 mg | INHALATION_SOLUTION | RESPIRATORY_TRACT | Status: DC | PRN

## 2022-03-24 MED ORDER — HYDROXYZINE HCL 25 MG PO TABS
25.0000 mg | ORAL_TABLET | Freq: Three times a day (TID) | ORAL | Status: DC | PRN
  Administered 2022-03-24: 25 mg via ORAL
  Filled 2022-03-24: qty 1

## 2022-03-24 MED ORDER — ALBUTEROL SULFATE HFA 108 (90 BASE) MCG/ACT IN AERS: 2.0000 | INHALATION_SPRAY | Freq: Four times a day (QID) | RESPIRATORY_TRACT | Status: DC | PRN

## 2022-03-24 MED ORDER — TRAZODONE HCL 50 MG PO TABS
50.0000 mg | ORAL_TABLET | Freq: Every evening | ORAL | Status: DC | PRN
  Administered 2022-03-24 – 2022-03-28 (×5): 50 mg via ORAL
  Filled 2022-03-24 (×5): qty 1

## 2022-03-24 MED ORDER — ACETAMINOPHEN 325 MG PO TABS
650.0000 mg | ORAL_TABLET | Freq: Four times a day (QID) | ORAL | Status: DC | PRN
  Administered 2022-03-24: 650 mg via ORAL
  Filled 2022-03-24: qty 2

## 2022-03-24 MED ORDER — MOMETASONE FURO-FORMOTEROL FUM 200-5 MCG/ACT IN AERO
2.0000 | INHALATION_SPRAY | Freq: Two times a day (BID) | RESPIRATORY_TRACT | Status: DC
  Filled 2022-03-24: qty 8.8

## 2022-03-24 MED ORDER — ALUM & MAG HYDROXIDE-SIMETH 200-200-20 MG/5ML PO SUSP: 30.0000 mL | ORAL | Status: DC | PRN

## 2022-03-24 MED ORDER — MAGNESIUM HYDROXIDE 400 MG/5ML PO SUSP: 30.0000 mL | Freq: Every day | ORAL | Status: DC | PRN

## 2022-03-24 NOTE — Progress Notes (Signed)
The patient rated her day as an 8 out of 10 since she didn't cry as much as yesterday. Her positive event for the day was that she was visited by her mother and the hallway felt "less scary" to her .

## 2022-03-24 NOTE — Group Note (Signed)
Recreation Therapy Group Note   Group Topic:Animal Assisted Therapy   Group Date: 03/24/2022 Start Time: 1430 End Time: 1510 Facilitators: Tracye Szuch-McCall, LRT,CTRS Location: [address] Dayroom   Animal-Assisted Activity (AAA) Program Checklist/Progress Notes Patient Eligibility Criteria Checklist & Daily Group note for Rec Tx Intervention  AAA/T Program Assumption of Risk Form signed by Patient/ or Parent Legal Guardian Yes  Patient is free of allergies or severe asthma Yes  Patient reports no fear of animals Yes  Patient reports no history of cruelty to animals Yes  Patient understands his/her participation is voluntary Yes  Patient washes hands before animal contact Yes  Patient washes hands after animal contact Yes   Affect/Mood: Appropriate   Participation Level: Engaged   Participation Quality: Independent   Behavior: Appropriate    Clinical Observations/Individualized Feedback:  Patient attended session and interacted appropriately with therapy dog and peers. Patient asked appropriate questions about therapy dog and his training. Patient shared stories about their pets at home with group.     Plan: Continue to engage patient in RT group sessions 2-3x/week.   Jessica Hebert, LRT,CTRS  03/24/2022 3:58 PM

## 2022-03-24 NOTE — Tx Team (Signed)
Initial Treatment Plan 03/24/2022 12:54 PM Jessica Hebert SMO:[id number]    PATIENT STRESSORS: Marital or family conflict   Medication change or noncompliance   Substance abuse     PATIENT STRENGTHS: Average or above average intelligence  Communication skills  Motivation for treatment/growth    PATIENT IDENTIFIED PROBLEMS:   Depression    Suicidal Ideation    Family Conflict    Suicide Attempt- Overdose on Klonopin       DISCHARGE CRITERIA:  Adequate post-discharge living arrangements Verbal commitment to aftercare and medication compliance  PRELIMINARY DISCHARGE PLAN: Outpatient therapy Participate in family therapy Return to previous living arrangement  PATIENT/FAMILY INVOLVEMENT: This treatment plan has been presented to and reviewed with the patient, Jessica Hebert. The patient has been given the opportunity to ask questions and make suggestions.  Lurline Del Xochilt Conant, RN 03/24/2022, 12:54 PM

## 2022-03-24 NOTE — Group Note (Signed)
Date:  03/24/2022 Time:  1:36 PM  Group Topic/Focus:  Orientation:   The focus of this group is to educate the patient on the purpose and policies of crisis stabilization and provide a format to answer questions about their admission.  The group details unit policies and expectations of patients while admitted.    Participation Level:  Did Not Attend  Participation Quality:      Affect:      Cognitive:      Insight: None  Engagement in Group:  Modes of Intervention:      Additional Comments:     Jerrye Beavers 03/24/2022, 1:36 PM
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
## 2022-03-24 NOTE — ED Notes (Signed)
Attempted to call report at [facility], unable to take report until 10AM.

## 2022-03-24 NOTE — ED Notes (Signed)
[facility]  Dept   called  for  transport  to  [facility]  beh  med

## 2022-03-24 NOTE — ED Provider Notes (Signed)
Vitals:   03/24/22 0757 03/24/22 1038  BP: 134/83 134/83  Pulse: 99 99  Resp: 16 16  Temp: 98.6 F (37 C) 98.6 F (37 C)  SpO2: 97% 97%     Patient fully alert understanding and agreeable with the plan to transfer to [facility] behavioral health.  She is in no distress, and actually looking forward to going.   Delman Kitten, MD 03/24/22 1044

## 2022-03-24 NOTE — ED Notes (Signed)
Pt reports she cannot eat eggs, given egg biscuit for breakfast. Requesting cereal instead.

## 2022-03-24 NOTE — ED Provider Notes (Signed)
Emergency Medicine Observation Re-evaluation Note  Jessica Hebert is a 22 y.o. female, seen on rounds today.  Pt initially presented to the ED for complaints of Suicide Attempt  Currently, the patient is is no acute distress. Denies any concerns at this time.  Physical Exam  Blood pressure (!) 146/88, pulse 96, temperature 98 F (36.7 C), temperature source Oral, resp. rate 17, SpO2 100 %.  Physical Exam: General: No apparent distress Pulm: Normal WOB Neuro: Moving all extremities Psych: Resting comfortably     ED Course / MDM     I have reviewed the labs performed to date as well as medications administered while in observation.  Recent changes in the last 24 hours include: No acute events overnight.  Plan   Current plan: Patient awaiting psychiatric disposition. Patient is under full IVC at this time.    Accepted to [place] [facility] today.   Pammy Vesey, Delice Bison, DO 03/24/22 [phone]

## 2022-03-24 NOTE — ED Notes (Signed)
EMTALA reviewed by this RN at this time.  

## 2022-03-24 NOTE — ED Notes (Signed)
Patient has been accepted to [facility].  Patient assigned to room 305-1 Accepting physician is Dr. Mamie Levers.  Call report to [phone].  Representative was Manus Rudd., AC.

## 2022-03-25 ENCOUNTER — Encounter (HOSPITAL_COMMUNITY): Payer: Self-pay

## 2022-03-25 DIAGNOSIS — F50819 Binge eating disorder, unspecified: Secondary | ICD-10-CM

## 2022-03-25 DIAGNOSIS — F410 Panic disorder [episodic paroxysmal anxiety] without agoraphobia: Secondary | ICD-10-CM | POA: Insufficient documentation

## 2022-03-25 DIAGNOSIS — F332 Major depressive disorder, recurrent severe without psychotic features: Principal | ICD-10-CM | POA: Diagnosis present

## 2022-03-25 DIAGNOSIS — F5081 Binge eating disorder: Secondary | ICD-10-CM

## 2022-03-25 DIAGNOSIS — F411 Generalized anxiety disorder: Secondary | ICD-10-CM | POA: Insufficient documentation

## 2022-03-25 LAB — URINALYSIS, COMPLETE (UACMP) WITH MICROSCOPIC
Bilirubin Urine: NEGATIVE
Glucose, UA: NEGATIVE mg/dL
Hgb urine dipstick: NEGATIVE
Ketones, ur: NEGATIVE mg/dL
Nitrite: NEGATIVE
Protein, ur: NEGATIVE mg/dL
Specific Gravity, Urine: 1.025 (ref 1.005–1.030)
pH: 5 (ref 5.0–8.0)

## 2022-03-25 LAB — RAPID URINE DRUG SCREEN, HOSP PERFORMED
Amphetamines: NOT DETECTED
Barbiturates: NOT DETECTED
Benzodiazepines: NOT DETECTED
Cocaine: NOT DETECTED
Opiates: NOT DETECTED
Tetrahydrocannabinol: NOT DETECTED

## 2022-03-25 LAB — PREGNANCY, URINE: Preg Test, Ur: NEGATIVE

## 2022-03-25 MED ORDER — ALUM & MAG HYDROXIDE-SIMETH 200-200-20 MG/5ML PO SUSP: 30.0000 mL | ORAL | Status: DC | PRN

## 2022-03-25 MED ORDER — AMPHETAMINE-DEXTROAMPHETAMINE 10 MG PO TABS
5.0000 mg | ORAL_TABLET | Freq: Every day | ORAL | Status: AC
  Administered 2022-03-25: 5 mg via ORAL
  Filled 2022-03-25: qty 1

## 2022-03-25 MED ORDER — TRAZODONE HCL 50 MG PO TABS: 50.0000 mg | ORAL_TABLET | Freq: Every evening | ORAL | Status: DC | PRN

## 2022-03-25 MED ORDER — ESCITALOPRAM OXALATE 20 MG PO TABS
20.0000 mg | ORAL_TABLET | Freq: Every day | ORAL | Status: DC
  Administered 2022-03-25 – 2022-03-29 (×5): 20 mg via ORAL
  Filled 2022-03-25 (×4): qty 1
  Filled 2022-03-25: qty 7
  Filled 2022-03-25: qty 1

## 2022-03-25 MED ORDER — AMPHETAMINE-DEXTROAMPHETAMINE 10 MG PO TABS
5.0000 mg | ORAL_TABLET | Freq: Two times a day (BID) | ORAL | Status: AC
  Administered 2022-03-26 (×2): 5 mg via ORAL
  Filled 2022-03-25 (×2): qty 1

## 2022-03-25 MED ORDER — HYDROXYZINE HCL 25 MG PO TABS
25.0000 mg | ORAL_TABLET | Freq: Three times a day (TID) | ORAL | Status: DC | PRN
  Administered 2022-03-25 – 2022-03-28 (×5): 25 mg via ORAL
  Filled 2022-03-25 (×5): qty 1

## 2022-03-25 MED ORDER — ACETAMINOPHEN 325 MG PO TABS
650.0000 mg | ORAL_TABLET | Freq: Four times a day (QID) | ORAL | Status: DC | PRN
  Administered 2022-03-25 – 2022-03-29 (×4): 650 mg via ORAL
  Filled 2022-03-25 (×4): qty 2

## 2022-03-25 MED ORDER — MAGNESIUM HYDROXIDE 400 MG/5ML PO SUSP: 30.0000 mL | Freq: Every day | ORAL | Status: DC | PRN
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
## 2022-03-25 NOTE — Group Note (Signed)
Recreation Therapy Group Note   Group Topic:Problem Solving  Group Date: 03/25/2022 Start Time: 0935 End Time: 0952 Facilitators: Bronc Brosseau-McCall, LRT,CTRS Location: [address] Dayroom   Goal Area(s) Addresses:  Patient will effectively work with peer towards shared goal.  Patient will identify skills used to make activity successful.  Patient will share challenges and verbalize solution-driven approaches used. Patient will identify how skills used during activity can be used to reach post d/c goals.   Group Description: [employer]. Patients were provided the following materials: 5 drinking straws, 5 rubber bands, 5 paper clips, 2 index cards, and 2 drinking cups. Using the provided materials patients were asked to build a launching mechanism to launch a ping pong ball across the room, approximately 10 feet. Patients were divided into teams of 3-5. Instructions required all materials be incorporated into the device, functionality of items left to the peer group's discretion.   Affect/Mood: N/A   Participation Level: Did not attend    Clinical Observations/Individualized Feedback:     Plan: Continue to engage patient in RT group sessions 2-3x/week.   Desiraye Rolfson-McCall, LRT,CTRS 03/25/2022 1:20 PM

## 2022-03-25 NOTE — Plan of Care (Signed)
  Problem: Education: Goal: Knowledge of [facility] Education information/materials will improve Outcome: Progressing Goal: Emotional status will improve Outcome: Progressing Goal: Mental status will improve Outcome: Progressing Goal: Verbalization of understanding the information provided will improve Outcome: Progressing   Problem: Activity: Goal: Interest or engagement in activities will improve Outcome: Progressing Goal: Sleeping patterns will improve Outcome: Progressing   Problem: Coping: Goal: Ability to verbalize frustrations and anger appropriately will improve Outcome: Progressing Goal: Ability to demonstrate self-control will improve Outcome: Progressing   Problem: Health Behavior/Discharge Planning: Goal: Identification of resources available to assist in meeting health care needs will improve Outcome: Progressing Goal: Compliance with treatment plan for underlying cause of condition will improve Outcome: Progressing   Problem: Physical Regulation: Goal: Ability to maintain clinical measurements within normal limits will improve Outcome: Progressing   Problem: Safety: Goal: Periods of time without injury will increase Outcome: Progressing   Problem: Education: Goal: Utilization of techniques to improve thought processes will improve Outcome: Progressing Goal: Knowledge of the prescribed therapeutic regimen will improve Outcome: Progressing   Problem: Activity: Goal: Interest or engagement in leisure activities will improve Outcome: Progressing Goal: Imbalance in normal sleep/wake cycle will improve Outcome: Progressing   Problem: Coping: Goal: Coping ability will improve Outcome: Progressing Goal: Will verbalize feelings Outcome: Progressing   Problem: Health Behavior/Discharge Planning: Goal: Ability to make decisions will improve Outcome: Progressing Goal: Compliance with therapeutic regimen will improve Outcome: Progressing    Problem: Role Relationship: Goal: Will demonstrate positive changes in social behaviors and relationships Outcome: Progressing   Problem: Safety: Goal: Ability to disclose and discuss suicidal ideas will improve Outcome: Progressing Goal: Ability to identify and utilize support systems that promote safety will improve Outcome: Progressing   Problem: Self-Concept: Goal: Will verbalize positive feelings about self Outcome: Progressing Goal: Level of anxiety will decrease Outcome: Progressing   Problem: Education: Goal: Ability to state activities that reduce stress will improve Outcome: Progressing   Problem: Coping: Goal: Ability to identify and develop effective coping behavior will improve Outcome: Progressing   Problem: Self-Concept: Goal: Ability to identify factors that promote anxiety will improve Outcome: Progressing Goal: Level of anxiety will decrease Outcome: Progressing Goal: Ability to modify response to factors that promote anxiety will improve Outcome: Progressing   Problem: Education: Goal: Ability to make informed decisions regarding treatment will improve Outcome: Progressing   Problem: Coping: Goal: Coping ability will improve Outcome: Progressing   Problem: Health Behavior/Discharge Planning: Goal: Identification of resources available to assist in meeting health care needs will improve Outcome: Progressing   Problem: Medication: Goal: Compliance with prescribed medication regimen will improve Outcome: Progressing   Problem: Self-Concept: Goal: Ability to disclose and discuss suicidal ideas will improve Outcome: Progressing Goal: Will verbalize positive feelings about self Outcome: Progressing   

## 2022-03-25 NOTE — BH IP Treatment Plan (Signed)
Interdisciplinary Treatment and Diagnostic Plan Update  03/25/2022 Time of Session: 9:20am  Jessica Hebert MRN: [id number]  Principal Diagnosis: MDD (major depressive disorder), recurrent severe, without psychosis (HCC)  Secondary Diagnoses: Principal Problem:   MDD (major depressive disorder), recurrent severe, without psychosis (HCC) Active Problems:   Asthma   Intractable migraine without aura and without status migrainosus   Attention deficit hyperactivity disorder (ADHD)   Intentional overdose (HCC)   Generalized anxiety disorder   Panic disorder   Binge eating disorder   Current Medications:  Current Facility-Administered Medications  Medication Dose Route Frequency Provider Last Rate Last Admin   acetaminophen (TYLENOL) tablet 650 mg  650 mg Oral Q6H PRN Ntuen, Jesusita Oka, FNP       albuterol (VENTOLIN HFA) 108 (90 Base) MCG/ACT inhaler 2 puff  2 puff Inhalation Q6H PRN Nkwenti, Doris, NP       alum & mag hydroxide-simeth (MAALOX/MYLANTA) 200-200-20 MG/5ML suspension 30 mL  30 mL Oral Q4H PRN Ntuen, Jesusita Oka, FNP       amphetamine-dextroamphetamine (ADDERALL) tablet 5 mg  5 mg Oral Q lunch Kizzie Ide B, MD       Followed by   Melene Muller ON 03/26/2022] amphetamine-dextroamphetamine (ADDERALL) tablet 5 mg  5 mg Oral BID Kizzie Ide B, MD       escitalopram (LEXAPRO) tablet 20 mg  20 mg Oral Daily Kizzie Ide B, MD       hydrOXYzine (ATARAX) tablet 25 mg  25 mg Oral TID PRN Ntuen, Jesusita Oka, FNP       magnesium hydroxide (MILK OF MAGNESIA) suspension 30 mL  30 mL Oral Daily PRN Ntuen, Jesusita Oka, FNP       traZODone (DESYREL) tablet 50 mg  50 mg Oral QHS PRN Starleen Blue, NP   50 mg at 03/24/22 2123   PTA Medications: Medications Prior to Admission  Medication Sig Dispense Refill Last Dose   albuterol (PROVENTIL HFA;VENTOLIN HFA) 108 (90 Base) MCG/ACT inhaler Inhale 2 puffs into the lungs every 4 (four) hours as needed for wheezing or shortness of breath. 1 Inhaler 0     budesonide-formoterol (SYMBICORT) 160-4.5 MCG/ACT inhaler Inhale 2 puffs into the lungs 2 (two) times daily.      cetirizine (ZYRTEC ALLERGY) 10 MG tablet Take 1 tablet (10 mg total) by mouth daily. 30 tablet 0    EPINEPHrine 0.3 mg/0.3 mL IJ SOAJ injection Inject 0.3 mg into the muscle as needed for anaphylaxis.      escitalopram (LEXAPRO) 20 MG tablet Take 20 mg by mouth daily.      etonogestrel-ethinyl estradiol (NUVARING) 0.12-0.015 MG/24HR vaginal ring INSERT 1 RING IN THE VAGINA AND LEAVE IN PLACE FOR 3 CONSECUTIVE WEEKS, THEN REMOVE FOR 1 WEEK AS DIRECTED. 3 each 3    fluticasone (FLONASE) 50 MCG/ACT nasal spray Place 2 sprays into both nostrils daily. (Patient not taking: Reported on 03/23/2022) 1 g 0    ibuprofen (ADVIL) 600 MG tablet Take 1 tablet (600 mg total) by mouth every 6 (six) hours as needed. (Patient not taking: Reported on 03/23/2022) 30 tablet 0    lidocaine (XYLOCAINE) 2 % solution Use as directed 15 mLs in the mouth or throat every 6 (six) hours as needed for mouth pain. (Patient not taking: Reported on 03/23/2022) 100 mL 0    meloxicam (MOBIC) 7.5 MG tablet Take 1 tablet (7.5 mg total) by mouth daily. (Patient not taking: Reported on 03/23/2022) 30 tablet 0    montelukast (SINGULAIR) 10 MG tablet  Take 10 mg by mouth at bedtime. (Patient not taking: Reported on 03/23/2022)       Patient Stressors: Marital or family conflict   Medication change or noncompliance   Substance abuse    Patient Strengths: Average or above average intelligence  Communication skills  Motivation for treatment/growth   Treatment Modalities: Medication Management, Group therapy, Case management,  1 to 1 session with clinician, Psychoeducation, Recreational therapy.   Physician Treatment Plan for Primary Diagnosis: MDD (major depressive disorder), recurrent severe, without psychosis ([place]) Long Term Goal(s):     Short Term Goals: Ability to identify changes in lifestyle to reduce recurrence of  condition will improve Ability to verbalize feelings will improve Ability to disclose and discuss suicidal ideas Ability to demonstrate self-control will improve Ability to identify and develop effective coping behaviors will improve Ability to maintain clinical measurements within normal limits will improve Compliance with prescribed medications will improve Ability to identify triggers associated with substance abuse/mental health issues will improve  Medication Management: Evaluate patient's response, side effects, and tolerance of medication regimen.  Therapeutic Interventions: 1 to 1 sessions, Unit Group sessions and Medication administration.  Evaluation of Outcomes: Not Met  Physician Treatment Plan for Secondary Diagnosis: Principal Problem:   MDD (major depressive disorder), recurrent severe, without psychosis ([facility]) Active Problems:   Asthma   Intractable migraine without aura and without status migrainosus   Attention deficit hyperactivity disorder (ADHD)   Intentional overdose ([place])   Generalized anxiety disorder   Panic disorder   Binge eating disorder  Long Term Goal(s):     Short Term Goals: Ability to identify changes in lifestyle to reduce recurrence of condition will improve Ability to verbalize feelings will improve Ability to disclose and discuss suicidal ideas Ability to demonstrate self-control will improve Ability to identify and develop effective coping behaviors will improve Ability to maintain clinical measurements within normal limits will improve Compliance with prescribed medications will improve Ability to identify triggers associated with substance abuse/mental health issues will improve     Medication Management: Evaluate patient's response, side effects, and tolerance of medication regimen.  Therapeutic Interventions: 1 to 1 sessions, Unit Group sessions and Medication administration.  Evaluation of Outcomes: Not Met   RN Treatment Plan for  Primary Diagnosis: MDD (major depressive disorder), recurrent severe, without psychosis ([place]) Long Term Goal(s): Knowledge of disease and therapeutic regimen to maintain health will improve  Short Term Goals: Ability to remain free from injury will improve, Ability to participate in decision making will improve, Ability to verbalize feelings will improve, Ability to disclose and discuss suicidal ideas, and Ability to identify and develop effective coping behaviors will improve  Medication Management: RN will administer medications as ordered by provider, will assess and evaluate patient's response and provide education to patient for prescribed medication. RN will report any adverse and/or side effects to prescribing provider.  Therapeutic Interventions: 1 on 1 counseling sessions, Psychoeducation, Medication administration, Evaluate responses to treatment, Monitor vital signs and CBGs as ordered, Perform/monitor CIWA, COWS, AIMS and Fall Risk screenings as ordered, Perform wound care treatments as ordered.  Evaluation of Outcomes: Not Met   LCSW Treatment Plan for Primary Diagnosis: MDD (major depressive disorder), recurrent severe, without psychosis ([place]) Long Term Goal(s): Safe transition to appropriate next level of care at discharge, Engage patient in therapeutic group addressing interpersonal concerns.  Short Term Goals: Engage patient in aftercare planning with referrals and resources, Increase social support, Increase emotional regulation, Facilitate acceptance of mental health diagnosis and concerns,  Identify triggers associated with mental health/substance abuse issues, and Increase skills for wellness and recovery  Therapeutic Interventions: Assess for all discharge needs, 1 to 1 time with Social worker, Explore available resources and support systems, Assess for adequacy in community support network, Educate family and significant other(s) on suicide prevention, Complete Psychosocial  Assessment, Interpersonal group therapy.  Evaluation of Outcomes: Not Met   Progress in Treatment: Attending groups: Yes. Participating in groups: Yes. Taking medication as prescribed: Yes. Toleration medication: Yes. Family/Significant other contact made: Yes, individual(s) contacted:  If patient provides consents  Patient understands diagnosis: Yes. Discussing patient identified problems/goals with staff: Yes. Medical problems stabilized or resolved: Yes. Denies suicidal/homicidal ideation: Yes. Issues/concerns per patient self-inventory: No.   New problem(s) identified: No, Describe:  None reported   New Short Term/Long Term Goal(s): medication stabilization, elimination of SI thoughts, development of comprehensive mental wellness plan.   Patient Goals: "Build  better and healthier relationships and manage my medications"    Discharge Plan or Barriers: Patient recently admitted. CSW will continue to follow and assess for appropriate referrals and possible discharge planning.   Reason for Continuation of Hospitalization: Anxiety Depression Medication stabilization Suicidal ideation  Estimated Length of Stay: 3 to 7 days   Last [place] Suicide Severity Risk Score: [place] Admission (Current) from 03/24/2022 in [facility] ED from 03/23/2022 in [facility] from 01/22/2022 in [facility] [place] No Risk High Risk No Risk       Last PHQ 2/9 Scores:     No data to display          Scribe for Treatment Team: Darleen Crocker, Latanya Presser 03/25/2022 1:28 PM

## 2022-03-25 NOTE — Progress Notes (Signed)
The patient attended the evening N.A.meeting.  

## 2022-03-25 NOTE — Progress Notes (Signed)
   03/24/22 2300  Psych Admission Type (Psych Patients Only)  Admission Status Involuntary  Psychosocial Assessment  Patient Complaints Worrying;Anxiety  Eye Contact Fair  Facial Expression Animated;Sad  Affect Anxious  Speech Logical/coherent  Interaction Assertive  Motor Activity Other (Comment) (WNL)  Appearance/Hygiene Unremarkable  Behavior Characteristics Cooperative;Appropriate to situation;Anxious  Mood Sad;Anxious  Aggressive Behavior  Effect No apparent injury  Thought Process  Coherency WDL  Content WDL  Delusions None reported or observed  Perception WDL  Hallucination None reported or observed  Judgment WDL  Confusion WDL  Danger to Self  Current suicidal ideation? Denies  Danger to Others  Danger to Others None reported or observed

## 2022-03-25 NOTE — Progress Notes (Signed)
Patient appears pleasant. Patient denies SI/HI/AVH. Pt reports sleep was "very good" and appetite is good. Pt reports anxiety and depression is 0/10. Pt has rapid speech. Pt urine was collected for lab. Patient complied with morning medication with no reported side effects. Patient remains safe on Q34min checks and contracts for safety.      03/25/22 1022  Psych Admission Type (Psych Patients Only)  Admission Status Involuntary  Psychosocial Assessment  Patient Complaints None  Eye Contact Fair  Facial Expression Animated  Affect Anxious  Speech Rapid;Loud;Logical/coherent  Interaction Assertive  Motor Activity Fidgety  Appearance/Hygiene Unremarkable  Behavior Characteristics Cooperative;Anxious  Mood Anxious  Thought Process  Coherency WDL  Content WDL  Delusions None reported or observed  Perception WDL  Hallucination None reported or observed  Judgment Impaired  Confusion None  Danger to Self  Current suicidal ideation? Denies  Danger to Others  Danger to Others None reported or observed

## 2022-03-25 NOTE — H&P (Signed)
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 Frequency Provider Last Rate Last Admin   acetaminophen (TYLENOL) tablet 650 mg  650 mg Oral Q6H PRN Ntuen, Jesusita Okaina C, FNP       albuterol (VENTOLIN HFA) 108 (90 Base) MCG/ACT inhaler 2 puff  2 puff Inhalation Q6H PRN Nkwenti, Doris, NP       alum & mag hydroxide-simeth (MAALOX/MYLANTA) 200-200-20 MG/5ML suspension 30 mL  30 mL Oral Q4H PRN Ntuen, Jesusita Okaina C, FNP       amphetamine-dextroamphetamine (ADDERALL) tablet 5 mg  5 mg Oral Q lunch Kizzie IdeHoang, Daniela B, MD       Followed by   Melene Muller[START ON 03/26/2022] amphetamine-dextroamphetamine (ADDERALL) tablet 5 mg  5 mg Oral BID Kizzie IdeHoang, Daniela B, MD       escitalopram (LEXAPRO) tablet 20 mg  20 mg Oral Daily Kizzie IdeHoang, Daniela B, MD       hydrOXYzine (ATARAX) tablet 25 mg  25 mg Oral TID PRN Ntuen, Jesusita Okaina C, FNP       magnesium hydroxide (MILK OF MAGNESIA) suspension 30 mL  30 mL Oral Daily PRN Ntuen, Jesusita Okaina C, FNP       traZODone (DESYREL) tablet 50 mg  50 mg Oral QHS PRN Starleen BlueNkwenti, Doris, NP   50 mg at 03/24/22 2123     PTA Medications: Medications Prior to Admission  Medication Sig Dispense Refill Last Dose   albuterol (PROVENTIL HFA;VENTOLIN HFA) 108 (90 Base) MCG/ACT inhaler Inhale 2 puffs into the lungs every 4 (four) hours as needed for wheezing or shortness of breath. 1 Inhaler 0    budesonide-formoterol (SYMBICORT) 160-4.5 MCG/ACT inhaler Inhale 2 puffs into the lungs 2 (two) times daily.      cetirizine (ZYRTEC ALLERGY) 10 MG tablet Take 1 tablet (10 mg total) by mouth daily. 30 tablet 0    EPINEPHrine 0.3 mg/0.3 mL IJ SOAJ injection Inject 0.3 mg into the muscle as needed for anaphylaxis.      escitalopram (LEXAPRO) 20 MG tablet Take 20 mg by mouth daily.      etonogestrel-ethinyl estradiol (NUVARING) 0.12-0.015 MG/24HR vaginal ring INSERT 1 RING IN THE VAGINA AND LEAVE IN PLACE FOR 3 CONSECUTIVE WEEKS, THEN REMOVE FOR 1 WEEK AS DIRECTED. 3 each 3    fluticasone (FLONASE) 50 MCG/ACT nasal spray Place 2 sprays into both nostrils daily. (Patient not taking: Reported on 03/23/2022) 1 g 0    ibuprofen (ADVIL) 600 MG tablet Take 1 tablet (600 mg total) by mouth every 6 (six) hours as needed. (Patient not taking: Reported on 03/23/2022) 30 tablet 0    lidocaine (XYLOCAINE) 2 % solution Use as  directed 15 mLs in the mouth or throat every 6 (six) hours as needed for mouth pain. (Patient not taking: Reported on 03/23/2022) 100 mL 0    meloxicam (MOBIC) 7.5 MG tablet Take 1 tablet (7.5 mg total) by mouth daily. (Patient not taking: Reported on 03/23/2022) 30 tablet 0    montelukast (SINGULAIR) 10 MG tablet Take 10 mg by mouth at bedtime. (Patient not taking: Reported on 03/23/2022)        Physical Findings: AIMS: No CIWA:  None COWS:  None  Musculoskeletal: Strength & Muscle Tone: within normal limits Gait & Station: normal Patient leans: N/A  Mental Status Exam General Appearance: appears at stated age, casually dressed and groomed    Behavior: pleasant and cooperative    Psychomotor Activity:  picking at her nails during assessment   Eye Contact: fair  Speech: increased amount. Normal tone, volume and fluency      Mood: euthymic  Affect: congruent, pleasant and interactive   Thought Process: some circumstantial thought process noted, no racing thoughts or flight of ideas  Descriptions of Associations: intact    Thought Content Hallucinations: denies AH, VH , does not appear responding to stimuli  Delusions: no paranoia, delusions of control, grandeur, ideas of reference, thought broadcasting, and magical thinking  Suicidal Thoughts: denies SI, intention, plan. Most recently had active SI on Monday when she took Klonopin Homicidal Thoughts: denies HI, intention, plan    Alertness/Orientation: alert and fully oriented    Insight: limited Judgment: fair   Memory: intact    Executive Functions  Concentration: intact  Attention Span: fair  Recall: intact  Fund of Knowledge: fair   Physical Exam  Constitutional:      Appearance: Normal appearance.  Cardiovascular:     Rate and Rhythm: Normal rate.  Pulmonary:     Effort: Pulmonary effort is normal.  Neurological:     General: No focal deficit present.     Mental Status: Alert and oriented to person, place, and time.    Review of Systems  Constitutional: Negative.  Negative for chills, fever and weight loss.  HENT: Negative.    Eyes: Negative.   Respiratory: Negative.    Cardiovascular: Negative.   Gastrointestinal:  Negative for constipation, diarrhea, nausea and vomiting.  Genitourinary: Negative.   Musculoskeletal: Negative.   Skin: Negative.   Neurological: Negative.  Negative for tingling.   Blood pressure 132/85, pulse (!) 109, temperature 97.8 F (36.6 C), temperature source Oral, resp. rate 16, height 5\' 10"  (1.778 m), weight 85 kg, SpO2 97 %. Body mass index is 26.89 kg/m.   PLAN: Safety and Monitoring:  -- Involuntary admission to inpatient psychiatric unit for safety, stabilization and  treatment  -- Daily contact with patient to assess and evaluate symptoms and progress in treatment  -- Patient's case to be discussed in multi-disciplinary team meeting  -- Observation Level : q15 minute checks  -- Vital signs:  q12 hours  -- Precautions: suicide, elopement, and assault  2. Medications:    Psychiatric Diagnosis and Treatment MDD, recurrent severe, without psychotic features ADHD GAD Panic Disorder  R/O Personality Disorder -Start Adderall 5 mg daily with meals, increase to 5 mg qAM and qPM for ADHD symptoms -Continue home Lexapro 20 mg for depressive symptoms and anxiety  Trazodone 50 mg at bedtime as needed for insomnia Atarax 25 mg TID as needed for anxiety  Medical Diagnosis and Treatment Asthma -Continue home albuterol PRN  Patient does not need nicotine replacement  Other as  needed medications  Tylenol 650 mg every 6 hours as needed for pain Mylanta 30 mL every 4 hours as needed for indigestion Milk of magnesia 30 mL daily as needed for constipation  The risks/benefits/side-effects/alternatives to the above medication were discussed in detail with the patient and time was given for questions. The patient consents to medication trial. FDA black box warnings, if present, were discussed.  The patient is agreeable with the medication plan, as above. We will monitor the patient's response to pharmacologic treatment, and adjust medications as necessary.  3. Routine and other pertinent labs: EKG monitoring: QTc: 434 on 1/15, sinus tachycardia  Metabolism / endocrine: BMI: Body mass index is 26.89 kg/m.  1/15 labs BMP WNL UDS negative CBC Hgb 15.4  TSH pending Lipid panel pending A1c pending  4. Group Therapy:  -- Encouraged patient to participate in unit milieu and in scheduled group therapies   -- Short Term Goals: Ability to identify changes in lifestyle to reduce recurrence of condition will improve, Ability to verbalize feelings will improve,  Ability to disclose and discuss suicidal ideas, Ability to demonstrate self-control will improve, Ability to identify and develop effective coping behaviors will improve, Ability to maintain clinical measurements within normal limits will improve, Compliance with prescribed medications will improve, and Ability to identify triggers associated with substance abuse/mental health issues will improve  -- Long Term Goals: Improvement in symptoms so as ready for discharge -- Patient is encouraged to participate in group therapy while admitted to the psychiatric unit. -- We will address other chronic and acute stressors, which contributed to the patient's MDD (major depressive disorder) in order to reduce the risk of self-harm at discharge.  5. Discharge Planning:   -- Social work and case management to assist with discharge planning and identification of hospital follow-up needs prior to discharge  -- Estimated LOS: 5-7 days  -- Discharge Concerns: Need to establish a safety plan; Medication compliance and effectiveness  -- Discharge Goals: Return home with outpatient referrals for mental health follow-up including medication management/psychotherapy  I certify that inpatient services furnished can reasonably be expected to improve the patient's condition.    I discussed my assessment, planned testing and intervention for the patient with Dr. Sherron Flemings who agrees with my formulated course of action.   Treatment Plan Summary: Daily contact with patient to assess and evaluate symptoms and progress in treatment and medication management  Daryel November, Medical Student,  Barron Schmid, Medical Student, Kizzie Ide, PGY-1 Psychiatry 1/17/202412:04 PM

## 2022-03-25 NOTE — Group Note (Signed)
[facility] LCSW Group Therapy Note   Group Date: 03/24/2022 Start Time: 1100 End Time: 1200   Type of Therapy/Topic:  Group Therapy:  Balance in Life  Participation Level:  Did Not Attend   Description of Group:    This group will address the concept of balance and how it feels and looks when one is unbalanced. Patients will be encouraged to process areas in their lives that are out of balance, and identify reasons for remaining unbalanced. Facilitators will guide patients utilizing problem- solving interventions to address and correct the stressor making their life unbalanced. Understanding and applying boundaries will be explored and addressed for obtaining  and maintaining a balanced life. Patients will be encouraged to explore ways to assertively make their unbalanced needs known to significant others in their lives, using other group members and facilitator for support and feedback.  Therapeutic Goals: Patient will identify two or more emotions or situations they have that consume much of in their lives. Patient will identify signs/triggers that life has become out of balance:  Patient will identify two ways to set boundaries in order to achieve balance in their lives:  Patient will demonstrate ability to communicate their needs through discussion and/or role plays  Summary of Patient Progress:        Therapeutic Modalities:   Cognitive Behavioral Therapy Solution-Focused Therapy Assertiveness Training   Prayan Ulin S Kourtnee Lahey, LCSW 

## 2022-03-25 NOTE — [facility] Suicide Risk Assessment (Signed)
[place] INPATIENT:  Family/Significant Other Suicide Prevention Education  Suicide Prevention Education:  Education Completed; 03-25-2022 Jessica Hebert [phone] (Mom)   has been identified by the patient as the family member/significant other with whom the patient will be residing, and identified as the person(s) who will aid the patient in the event of a mental health crisis (suicidal ideations/suicide attempt).  With written consent from the patient, Jessica Hebert [phone] [facility])   has been provided the following suicide prevention education, prior to the and/or following the discharge of the patient.  The suicide prevention education provided includes the following: Suicide risk factors Suicide prevention and interventions National Suicide Hotline telephone number [facility] assessment telephone number [facility] Emergency Assistance [place] and/or Residential Mobile Crisis Unit telephone number  Request made of family/significant other to: Remove weapons (e.g., guns, rifles, knives), all items previously/currently identified as safety concern.   Remove drugs/medications (over-the-counter, prescriptions, illicit drugs), all items previously/currently identified as a safety concern.  The family member/significant other verbalizes understanding of the suicide prevention education information provided. Jessica Hebert [phone] (Mom) agrees to remove the items of safety concern listed above.  Jessica Hebert 03/25/2022, 3:22 PM

## 2022-03-25 NOTE — [facility] Suicide Risk Assessment (Signed)
Suicide Risk Assessment  Admission Assessment    [facility] Admission Suicide Risk Assessment   Nursing information obtained from:  Patient Demographic factors:  Adolescent or young adult, Caucasian Current Mental Status:  NA Loss Factors:  NA Historical Factors:  Prior suicide attempts Risk Reduction Factors:  Positive coping skills or problem solving skills, Sense of responsibility to family  Total Time spent with patient: 1 hour Principal Problem: MDD (major depressive disorder), recurrent severe, without psychosis ([place]) Diagnosis:  Principal Problem:   MDD (major depressive disorder), recurrent severe, without psychosis ([place]) Active Problems:   Asthma   Attention deficit hyperactivity disorder (ADHD)   Generalized anxiety disorder   Subjective Data:   Jessica Hebert is a 22 y.o., female with a past psychiatric history significant for MDD, Anxiety, ADHD who presents to the [facility] Involuntary from  [facility] Department for evaluation and management of suicide attempt by intentional overdose.    Initial assessment on 01/17, patient was evaluated on the inpatient unit, the patient was in good spirits and noted that she slept the best she had slept in a long time last night. Prior to her presentation to the ED, she had been feeling sad and with low mood over the last month after her Mom and her Mom's boyfriend broke up. She felt guilty that she caused the break up because she voiced her opinions about her Mom's boyfriend. She also felt very isolated from her Mom and sister after this. She got into an argument with her Mom on Monday (01/15) around 11 AM-12:30 PM regarding her Mom's boyfriend and had her Klonopin 0.5 mg bottle sitting next to her bedside so she locked her bedroom door and texted her Mom and sister "I'm sorry" before taking 10-15 tablets of her prescribed Klonopin. Immediately after taking the pills she had "instant regret" and texted her sister, called 48,  and called her Mom for help. She had symptoms of heaviness in her limbs, feeling like she couldn't move, blacking out, tingling in her limbs, and overall felt very scared. She said as she began to get help from her Mom and transport via EMS she heard a call to her saying "you belong here". She says that after these events she realized she does have hope in her life and that she is happy and loves her support people and her dogs and cats.    She has no previous history of SI attempts but would punch herself in the stomach with her hand if she ever felt angry or anxious. She tells [place] she is very "schedule oriented and changes in my life make me agitated". She says that these symptoms come in waves and can be triggered by any changes in her life including if things in the household are moved around from their normal state or if her schedule changes. She has not self-harmed by cutting or other methods.  Continued Clinical Symptoms:  Alcohol Use Disorder Identification Test Final Score (AUDIT): 13 The "Alcohol Use Disorders Identification Test", Guidelines for Use in Primary Care, Second Edition.  World [facility]). Score between 0-7:  no or low risk or alcohol related problems. Score between 8-15:  moderate risk of alcohol related problems. Score between 16-19:  high risk of alcohol related problems. Score 20 or above:  warrants further diagnostic evaluation for alcohol dependence and treatment.   CLINICAL FACTORS:   Panic Attacks Depression:   Anhedonia Hopelessness Impulsivity   Musculoskeletal: Strength & Muscle Tone: within normal limits  Gait & Station: normal Patient leans: N/A  Psychiatric Specialty Exam:  General Appearance: appears at stated age, casually dressed and groomed   Behavior: pleasant and cooperative   Psychomotor Activity: picking at her nails during assessment  Eye Contact: fair  Speech: increased amount. Normal tone, volume and fluency    Mood:  euthymic  Affect: congruent, pleasant and interactive  Thought Process: some circumstantial thought process noted, no racing thoughts or flight of ideas  Descriptions of Associations: intact   Thought Content Hallucinations: denies AH, VH , does not appear responding to stimuli  Delusions: no paranoia, delusions of control, grandeur, ideas of reference, thought broadcasting, and magical thinking  Suicidal Thoughts: denies SI, intention, plan. Most recently had active SI on Monday when she took Klonopin Homicidal Thoughts: denies HI, intention, plan   Alertness/Orientation: alert and fully oriented   Insight: limited Judgment: fair  Memory: intact   Executive Functions  Concentration: intact  Attention Span: fair  Recall: intact  Fund of Knowledge: fair    Physical Exam:  Physical Exam  Constitutional:      Appearance: Normal appearance.  Cardiovascular:     Rate and Rhythm: tachycardia Pulmonary:     Effort: Pulmonary effort is normal.  Neurological:     General: No focal deficit present.     Mental Status: Alert and oriented to person, place, and time.    Review of Systems  Constitutional: Negative.  Negative for chills, fever and weight loss.  HENT: Negative.    Eyes: Negative.   Respiratory: Negative.    Cardiovascular: Negative.   Gastrointestinal:  Negative for constipation, diarrhea, nausea and vomiting.  Genitourinary: Negative.   Musculoskeletal: Negative.   Skin: Negative.   Neurological: Negative.  Negative for tingling.      COGNITIVE FEATURES THAT CONTRIBUTE TO RISK:  None    SUICIDE RISK:  Severe:  suicidal ideation with limited intensity, and duration, some specificity in terms of plans, had suicide attempt prior to admission, no associated intent, good self-control, limited dysphoria/symptomatology, some risk factors present, and identifiable protective factors, including available and accessible social support.  PLAN OF CARE: See H&P for  assessment and plan.   I certify that inpatient services furnished can reasonably be expected to improve the patient's condition.   Alesia Morin, MD 03/25/2022, 11:50 AM

## 2022-03-25 NOTE — Hospital Course (Signed)
  PLAN: Safety and Monitoring:  -- Involuntary admission to inpatient psychiatric unit for safety, stabilization and treatment  -- Daily contact with patient to assess and evaluate symptoms and progress in treatment  -- Patient's case to be discussed in multi-disciplinary team meeting  -- Observation Level : q15 minute checks  -- Vital signs:  q12 hours  -- Precautions: suicide, elopement, and assault  2. Medications:    Psychiatric Diagnosis and Treatment MDD, recurrent severe, without psychotic features ADHD GAD Panic Disorder  R/O Personality Disorder -Start Adderall 5 mg daily with meals, increase to 5 mg qAM and qPM for ADHD symptoms -Continue home Lexapro 20 mg for depressive symptoms and anxiety  Trazodone 50 mg at bedtime as needed for insomnia Atarax 25 mg TID as needed for anxiety  Medical Diagnosis and Treatment Asthma -Continue home albuterol PRN  Patient does not need nicotine replacement  Other as needed medications  Tylenol 650 mg every 6 hours as needed for pain Mylanta 30 mL every 4 hours as needed for indigestion Milk of magnesia 30 mL daily as needed for constipation    The risks/benefits/side-effects/alternatives to the above medication were discussed in detail with the patient and time was given for questions. The patient consents to medication trial. FDA black box warnings, if present, were discussed.  The patient is agreeable with the medication plan, as above. We will monitor the patient's response to pharmacologic treatment, and adjust medications as necessary.  3. Routine and other pertinent labs: EKG monitoring: QTc: 434 on 1/15, sinus tachycardia  Metabolism / endocrine: BMI: Body mass index is 26.89 kg/m.  1/15 labs BMP WNL UDS negative CBC Hgb 15.4  TSH pending Lipid panel pending A1c pending  4. Group Therapy:  -- Encouraged patient to participate in unit milieu and in scheduled group therapies   -- Short Term Goals: Ability to  identify changes in lifestyle to reduce recurrence of condition will improve, Ability to verbalize feelings will improve, Ability to disclose and discuss suicidal ideas, Ability to demonstrate self-control will improve, Ability to identify and develop effective coping behaviors will improve, Ability to maintain clinical measurements within normal limits will improve, Compliance with prescribed medications will improve, and Ability to identify triggers associated with substance abuse/mental health issues will improve  -- Long Term Goals: Improvement in symptoms so as ready for discharge -- Patient is encouraged to participate in group therapy while admitted to the psychiatric unit. -- We will address other chronic and acute stressors, which contributed to the patient's MDD (major depressive disorder) in order to reduce the risk of self-harm at discharge.  5. Discharge Planning:   -- Social work and case management to assist with discharge planning and identification of hospital follow-up needs prior to discharge  -- Estimated LOS: 5-7 days  -- Discharge Concerns: Need to establish a safety plan; Medication compliance and effectiveness  -- Discharge Goals: Return home with outpatient referrals for mental health follow-up including medication management/psychotherapy  I certify that inpatient services furnished can reasonably be expected to improve the patient's condition.    I discussed my assessment, planned testing and intervention for the patient with Dr. Caswell Corwin who agrees with my formulated course of action.

## 2022-03-26 LAB — LIPID PANEL
Cholesterol: 236 mg/dL — ABNORMAL HIGH (ref 0–200)
HDL: 48 mg/dL (ref >40)
LDL Cholesterol: 160 mg/dL — ABNORMAL HIGH (ref 0–99)
Total CHOL/HDL Ratio: 4.9 RATIO
Triglycerides: 141 mg/dL (ref <150)
VLDL: 28 mg/dL (ref 0–40)

## 2022-03-26 LAB — CBC
HCT: 43.8 % (ref 36.0–46.0)
Hemoglobin: 14.9 g/dL (ref 12.0–15.0)
MCH: 31 pg (ref 26.0–34.0)
MCHC: 34 g/dL (ref 30.0–36.0)
MCV: 91.3 fL (ref 80.0–100.0)
Platelets: 209 10*3/uL (ref 150–400)
RBC: 4.8 MIL/uL (ref 3.87–5.11)
RDW: 12.7 % (ref 11.5–15.5)
WBC: 7.1 10*3/uL (ref 4.0–10.5)
nRBC: 0 % (ref 0.0–0.2)

## 2022-03-26 LAB — HEMOGLOBIN A1C
Hgb A1c MFr Bld: 5.1 % (ref 4.8–5.6)
Mean Plasma Glucose: 99.67 mg/dL

## 2022-03-26 MED ORDER — TETANUS-DIPHTH-ACELL PERTUSSIS 5-2.5-18.5 LF-MCG/0.5 IM SUSY
0.5000 mL | PREFILLED_SYRINGE | Freq: Once | INTRAMUSCULAR | Status: AC
  Administered 2022-03-26: 0.5 mL via INTRAMUSCULAR
  Filled 2022-03-26: qty 0.5

## 2022-03-26 MED ORDER — AMPHETAMINE-DEXTROAMPHETAMINE 10 MG PO TABS
5.0000 mg | ORAL_TABLET | Freq: Every day | ORAL | Status: DC
  Administered 2022-03-27 – 2022-03-28 (×2): 5 mg via ORAL
  Filled 2022-03-26 (×2): qty 1

## 2022-03-26 MED ORDER — LORATADINE 10 MG PO TABS
10.0000 mg | ORAL_TABLET | Freq: Every day | ORAL | Status: DC
  Administered 2022-03-26 – 2022-03-29 (×4): 10 mg via ORAL
  Filled 2022-03-26 (×4): qty 1
  Filled 2022-03-26: qty 7
  Filled 2022-03-26 (×3): qty 1

## 2022-03-26 MED ORDER — AMPHETAMINE-DEXTROAMPHETAMINE 10 MG PO TABS
10.0000 mg | ORAL_TABLET | Freq: Every day | ORAL | Status: DC
  Administered 2022-03-27 – 2022-03-29 (×3): 10 mg via ORAL
  Filled 2022-03-26 (×3): qty 1

## 2022-03-26 MED ORDER — WHITE PETROLATUM EX OINT
TOPICAL_OINTMENT | CUTANEOUS | Status: AC
  Filled 2022-03-26: qty 5

## 2022-03-26 NOTE — Progress Notes (Signed)
   03/26/22 0800  Psych Admission Type (Psych Patients Only)  Admission Status Involuntary  Psychosocial Assessment  Patient Complaints Anxiety  Eye Contact Brief  Facial Expression Animated  Affect Anxious  Speech Logical/coherent  Interaction Assertive  Motor Activity Fidgety  Appearance/Hygiene Unremarkable  Behavior Characteristics Cooperative  Mood Anxious  Thought Process  Coherency WDL  Content WDL  Delusions None reported or observed  Perception WDL  Hallucination None reported or observed  Judgment WDL  Confusion None  Danger to Self  Current suicidal ideation? Denies  Danger to Others  Danger to Others None reported or observed

## 2022-03-26 NOTE — Progress Notes (Signed)
Pt reports anxiety and rates it a 4/10. Pt requested PRN before going to group. PRN and support given. During med pass. pt reports she had a "panic attack" during group as they were talking about addiction and one of her family members "passed away from addiction" which triggered her. Pt reports she left group early and took a "hot shower" to cope. Support given.   03/25/22 2134  Psych Admission Type (Psych Patients Only)  Admission Status Involuntary  Psychosocial Assessment  Patient Complaints Anxiety  Eye Contact Fair  Facial Expression Anxious;Animated  Affect Anxious  Speech International aid/development worker  Appearance/Hygiene Unremarkable  Behavior Characteristics Cooperative;Anxious  Mood Anxious  Thought Process  Coherency WDL  Content WDL  Delusions None reported or observed  Perception WDL  Hallucination None reported or observed  Judgment Impaired  Confusion None  Danger to Self  Current suicidal ideation? Denies  Danger to Others  Danger to Others None reported or observed
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
## 2022-03-26 NOTE — [facility] Group Notes (Signed)
Pt did attend group.  Pt did participate and was cooperative.

## 2022-03-26 NOTE — Group Note (Signed)
[facility] LCSW Group Therapy Note   Group Date: 03/26/2022 Start Time: 1100 End Time: 1200   Type of Therapy/Topic:  Group Therapy:  Balance in Life  Participation Level:  Active   Description of Group:    This group will address the concept of balance and how it feels and looks when one is unbalanced. Patients will be encouraged to process areas in their lives that are out of balance, and identify reasons for remaining unbalanced. Facilitators will guide patients utilizing problem- solving interventions to address and correct the stressor making their life unbalanced. Understanding and applying boundaries will be explored and addressed for obtaining  and maintaining a balanced life. Patients will be encouraged to explore ways to assertively make their unbalanced needs known to significant others in their lives, using other group members and facilitator for support and feedback.  Therapeutic Goals: Patient will identify two or more emotions or situations they have that consume much of in their lives. Patient will identify signs/triggers that life has become out of balance:  Patient will identify two ways to set boundaries in order to achieve balance in their lives:  Patient will demonstrate ability to communicate their needs through discussion and/or role plays  Summary of Patient Progress:    Patient provided good insight while engaged in the group session.    Therapeutic Modalities:   Cognitive Behavioral Therapy Solution-Focused Therapy Assertiveness Training   Dreyson Mishkin S Kalianne Fetting, LCSW

## 2022-03-26 NOTE — Progress Notes (Signed)
   03/25/22 2134  Psych Admission Type (Psych Patients Only)  Admission Status Involuntary  Psychosocial Assessment  Patient Complaints Anxiety  Eye Contact Fair  Facial Expression Anxious;Animated  Affect Anxious  Speech International aid/development worker  Appearance/Hygiene Unremarkable  Behavior Characteristics Cooperative;Anxious  Mood Anxious  Thought Process  Coherency WDL  Content WDL  Delusions None reported or observed  Perception WDL  Hallucination None reported or observed  Judgment Impaired  Confusion None  Danger to Self  Current suicidal ideation? Denies  Danger to Others  Danger to Others None reported or observed

## 2022-03-27 DIAGNOSIS — F332 Major depressive disorder, recurrent severe without psychotic features: Principal | ICD-10-CM

## 2022-03-27 NOTE — Group Note (Signed)
Date:  03/27/2022 Time:  10:21 AM  Group Topic/Focus:  Orientation:   The focus of this group is to educate the patient on the purpose and policies of crisis stabilization and provide a format to answer questions about their admission.  The group details unit policies and expectations of patients while admitted.    Participation Level:  Active  Participation Quality:  Appropriate  Affect:  Appropriate  Cognitive:  Appropriate  Insight: Good  Engagement in Group:  Engaged  Modes of Intervention:  Discussion  Additional Comments:     Jerrye Beavers 03/27/2022, 10:21 AM

## 2022-03-27 NOTE — Plan of Care (Signed)
  Problem: Activity: Goal: Sleeping patterns will improve Outcome: Progressing   Problem: Coping: Goal: Ability to demonstrate self-control will improve Outcome: Progressing   Problem: Safety: Goal: Periods of time without injury will increase Outcome: Progressing   Problem: Self-Concept: Goal: Level of anxiety will decrease Outcome: Progressing

## 2022-03-27 NOTE — Progress Notes (Addendum)
03/27/22 2000  Psych Admission Type (Psych Patients Only)  Admission Status Voluntary  Psychosocial Assessment  Patient Complaints Anxiety  Eye Contact Fair  Facial Expression Animated;Anxious  Affect Anxious;Silly  Speech Loud;Logical/coherent  Interaction Assertive;Needy  Motor Activity Fidgety  Appearance/Hygiene Unremarkable  Behavior Characteristics Cooperative;Appropriate to situation  Mood Anxious  Thought Process  Coherency WDL  Content WDL  Delusions None reported or observed  Perception WDL  Hallucination None reported or observed  Judgment WDL  Confusion None  Danger to Self  Current suicidal ideation? Denies  Danger to Others  Danger to Others None reported or observed    During assessment, patient presents with moderate anxiety.  Patient reports she has been using her coping skills today.  Patient states, "I ended a call with my mother instead of bickering with her."  Patient denies SI/HI/AVH, and contracts for safety.  PRN Hydroxyzine and Trazodone administered per [facility] per patient request.  Emotional support provided.  Patient is safe on the unit with Q 15 minute safety checks.

## 2022-03-27 NOTE — Group Note (Signed)
Recreation Therapy Group Note   Group Topic:Stress Management  Group Date: 03/27/2022 Start Time: 0945 End Time: 1003 Facilitators: Edsel Shives-McCall, LRT,CTRS Location: [address] Dayroom   Goal Area(s) Addresses:  Patient will actively participate in stress management techniques presented during session.  Patient will successfully identify benefit of practicing stress management post d/c.    Group Description: Guided Imagery. LRT provided education, instruction, and demonstration on practice of visualization via guided imagery. Patient was asked to participate in the technique introduced during session. LRT read a script that focused on visualizing a peaceful place that helps them relax, decompress and get away mentally.   LRT debriefed including topics of mindfulness, stress management and specific scenarios each patient could use these techniques. Patients were given suggestions of ways to access scripts post d/c and encouraged to explore Youtube and other apps available on smartphones, tablets, and computers.   Affect/Mood: Appropriate   Participation Level: Active   Participation Quality: Independent   Behavior: Appropriate   Speech/Thought Process: Focused   Insight: Good   Judgement: Good   Modes of Intervention: Script, Nature Sounds   Patient Response to Interventions:  Attentive   Education Outcome:  Acknowledges education and In group clarification offered    Clinical Observations/Individualized Feedback: Pt attended and participated in group session.  Pt expressed her place of relaxation was going to the dog park with her dog.     Plan: Continue to engage patient in RT group sessions 2-3x/week.   Tyress Loden-McCall, LRT,CTRS 03/27/2022 11:39 AM

## 2022-03-27 NOTE — Progress Notes (Signed)
03/27/22 2000  Psych Admission Type (Psych Patients Only)  Admission Status Voluntary  Psychosocial Assessment  Patient Complaints Anxiety  Eye Contact Fair  Facial Expression Animated;Anxious  Affect Anxious;Silly  Speech Loud;Logical/coherent  Interaction Assertive;Needy  Motor Activity Fidgety  Appearance/Hygiene Unremarkable  Behavior Characteristics Cooperative;Appropriate to situation  Mood Anxious  Thought Process  Coherency WDL  Content WDL  Delusions None reported or observed  Perception WDL  Hallucination None reported or observed  Judgment WDL  Confusion None  Danger to Self  Current suicidal ideation? Denies  Danger to Others  Danger to Others None reported or observed

## 2022-03-27 NOTE — Progress Notes (Addendum)
D: Pt denied SI/HI/AVH this morning. Pt rated her depression a 4/10, anxiety a 4/10, and feelings of hopelessness a 0/10. Pt has been pleasant, calm, and cooperative throughout the shift. Pt seen interacting in the dayroom throughout the day.   A: RN provided support and encouragement to patient. Pt given scheduled medications as prescribed. PRN Hydroxyzine given for anxiety. Q15 min checks verified for safety.    R: Patient verbally contracts for safety. Patient compliant with medications and treatment plan. Patient is interacting well on the unit. Pt is safe on the unit.   03/27/22 1026  Psych Admission Type (Psych Patients Only)  Admission Status Voluntary  Psychosocial Assessment  Patient Complaints Anxiety;Depression  Eye Contact Fair  Facial Expression Animated  Affect Anxious;Silly  Speech Logical/coherent;Loud  Interaction Assertive;Needy  Motor Activity Fidgety  Appearance/Hygiene Unremarkable  Behavior Characteristics Appropriate to situation;Cooperative  Mood Anxious;Depressed;Sad  Thought Process  Coherency WDL  Content WDL  Delusions None reported or observed  Perception WDL  Hallucination None reported or observed  Judgment WDL  Confusion None  Danger to Self  Current suicidal ideation? Denies  Danger to Others  Danger to Others None reported or observed

## 2022-03-27 NOTE — [facility] Group Notes (Signed)
Pt attended AA meeting.  
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
## 2022-03-27 NOTE — Progress Notes (Signed)
   03/26/22 2155  Psych Admission Type (Psych Patients Only)  Admission Status Voluntary  Psychosocial Assessment  Patient Complaints None  Eye Contact Brief  Facial Expression Animated  Affect Blunted  Speech Logical/coherent  Interaction Assertive  Motor Activity Fidgety  Appearance/Hygiene Unremarkable  Behavior Characteristics Cooperative  Thought Process  Coherency WDL  Content WDL  Delusions None reported or observed  Perception WDL  Hallucination None reported or observed  Judgment WDL  Confusion None  Danger to Self  Current suicidal ideation? Denies  Danger to Others  Danger to Others None reported or observed

## 2022-03-27 NOTE — Plan of Care (Signed)
  Problem: Education: Goal: Emotional status will improve Outcome: Progressing Goal: Mental status will improve Outcome: Progressing   Problem: Activity: Goal: Interest or engagement in activities will improve Outcome: Progressing   Problem: Coping: Goal: Ability to verbalize frustrations and anger appropriately will improve Outcome: Progressing Goal: Ability to demonstrate self-control will improve Outcome: Progressing

## 2022-03-27 NOTE — Group Note (Signed)
Date:  03/27/2022 Time:  3:50 PM  Group Topic/Focus:  Rediscovering Joy:   The focus of this group is to explore various ways to relieve stress in a positive manner.    Participation Level:  Active  Participation Quality:  Appropriate  Affect:  Appropriate  Cognitive:  Appropriate  Insight: Appropriate  Engagement in Group:  Engaged  Modes of Intervention:  Exploration  Additional Comments:   Patient described coping skills for destructive feeling that arise during  the recovery process. She stated listening to music, exercise  and dancing as a way to combat those feelings.  Jerrye Beavers 03/27/2022, 3:50 PM

## 2022-03-28 DIAGNOSIS — F502 Bulimia nervosa: Secondary | ICD-10-CM | POA: Insufficient documentation
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
## 2022-03-28 NOTE — Progress Notes (Signed)
   Patient requests to speak with Probation officer in private.  Patient reports that she feels uncomfortable around a specific female peer.  Patient reports that the peer is intrusive, pants sag exposing the stomach and crack of the bottom, and tries to get her and others to engage in complaints about staff that they know nothing about, always refers to people as "honey" and is always wanting to "group hug."  Writer thanked patient for the information.  Writer advised patient that her concerns will be addressed.  Writer communicated patient's concerns to the nurse taking care of the patient.

## 2022-03-28 NOTE — Progress Notes (Addendum)
Pt is A&OX4, calm, pleasant, talkative, malingering, denies suicidal ideations, denies homicidal ideations, denies auditory hallucinations and denies visual hallucinations. Pt verbally agrees to approach staff if these become apparent and before harming self or others. Pt denies experiencing nightmares. Mood and affect are congruent. Pt appetite is ok. No complaints of anxiety. Pt c/o a migraine headache ("7/10") the first half of the shift. Pt reports, "The Tylenol helped. I feel better now." Pt's memory appears to be grossly intact, and Pt hasn't displayed any injurious behaviors. Pt is medication compliant. There's no evidence of suicidal intent. Psychomotor activity was WNL. No s/s of Parkinson, Dystonia, Akathisia and/or Tardive Dyskinesia noted.

## 2022-03-28 NOTE — Group Note (Signed)
LCSW Group Therapy Note  Group Date: 03/28/2022 Start Time: 1010 End Time: 1110   Type of Therapy and Topic:  Group Therapy: Anger Cues and Responses  Participation Level:  Active   Description of Group:   In this group, patients learned how to recognize the physical, cognitive, emotional, and behavioral responses they have to anger-provoking situations.  They identified a recent time they became angry and how they reacted.  They analyzed how their reaction was possibly beneficial and how it was possibly unhelpful.  The group discussed a variety of healthier coping skills that could help with such a situation in the future.  Focus was placed on how helpful it is to recognize the underlying emotions to our anger, because working on those can lead to a more permanent solution as well as our ability to focus on the important rather than the urgent.  Therapeutic Goals: Patients will remember their last incident of anger and how they felt emotionally and physically, what their thoughts were at the time, and how they behaved. Patients will identify how their behavior at that time worked for them, as well as how it worked against them. Patients will explore possible new behaviors to use in future anger situations. Patients will learn that anger itself is normal and cannot be eliminated, and that healthier reactions can assist with resolving conflict rather than worsening situations.  Summary of Patient Progress:  Jessica Hebert was active during the group. Se shared a recent occurrence wherein feeling that she is not being heard and not being given the opportunity to explain herself led to anger.  She stated that sometimes this ends up being self-directed with overdoses, hitting herself, or overeating and purging.  She demonstrated fair insight into the subject matter, was respectful of peers, and participated throughout the entire session.  There were a number of occasions where she was intrusive and  monopolizing but she could be redirected.  Therapeutic Modalities:   Cognitive Behavioral Therapy    Marquette Old 03/28/2022  2:36 PM

## 2022-03-28 NOTE — Progress Notes (Signed)
Pt c/o feeling "feverish." Writer retrieved Pt's temp, which was 97.21F oral.

## 2022-03-28 NOTE — Progress Notes (Signed)
[facility] Group Notes:  (Nursing/MHT/Case Management/Adjunct)  Date:  03/28/2022  Time:  2000  Type of Therapy:   wrap up group  Participation Level:  Active  Participation Quality:  Appropriate, Attentive, Sharing, and Supportive  Affect:  Excited  Cognitive:  Alert  Insight:  Improving  Engagement in Group:  Engaged  Modes of Intervention:  Clarification, Education, and Support  Summary of Progress/Problems: Positive thinking and positive change were discussed.   Shellia Cleverly 03/28/2022, 11:23 PM

## 2022-03-28 NOTE — Progress Notes (Signed)
   03/28/22 0540  15 Minute Checks  Location Bedroom  Visual Appearance Calm  Behavior Sleeping  Sleep (Behavioral Health Patients Only)  Calculate sleep? (Click Yes once per 24 hr at 0600 safety check) Yes  Documented sleep last 24 hours 7.5

## 2022-03-29 MED ORDER — AMPHETAMINE-DEXTROAMPHETAMINE 5 MG PO TABS: 5.0000 mg | ORAL_TABLET | Freq: Every day | ORAL | 0 refills | Status: DC

## 2022-03-29 MED ORDER — AMPHETAMINE-DEXTROAMPHETAMINE 10 MG PO TABS: 10.0000 mg | ORAL_TABLET | Freq: Every day | ORAL | 0 refills | Status: DC

## 2022-03-29 NOTE — [facility] Suicide Risk Assessment (Signed)
[facility] Discharge Suicide Risk Assessment   Principal Problem: MDD (major depressive disorder), recurrent severe, without psychosis ([place]) Discharge Diagnoses: Principal Problem:   MDD (major depressive disorder), recurrent severe, without psychosis ([place]) Active Problems:   Asthma   Intractable migraine without aura and without status migrainosus   Attention deficit hyperactivity disorder (ADHD)   Intentional overdose ([place])   Generalized anxiety disorder   Panic disorder   Bulimia nervosa   Reason for admission:  Jessica Hebert is a 22 y.o., female with a past psychiatric history significant for MDD, Panic Disorder, Generalized Anxiety Disorder, ADHD, Binge Eating Disorder, who presents to the [facility] Involuntary from  [facility] Emergency Department for evaluation and management of suicide attempt by intentional overdose.   PTA Medications:  Lexapro 20 mg daily  Hospital Course:   During the patient's hospitalization, patient had extensive initial psychiatric evaluation, and follow-up psychiatric evaluations every day.  Psychiatric diagnoses provided upon initial assessment:  MDD, recurrent severe, without psychotic features ADHD GAD Panic Disorder  R/O Personality Disorder  Patient's psychiatric medications were adjusted on admission:  -Start Adderall 5 mg daily with meals, increase to 5 mg qAM and qPM for ADHD symptoms -Continue home Lexapro 20 mg for depressive symptoms and anxiety  During the hospitalization, other adjustments were made to the patient's psychiatric medication regimen:  -Adderall 10 mg qAM and 5 mg qPM for ADHD   During the hospitalization, patient's work-up:  EKG monitoring: QTc: 434 on 1/15, sinus tachycardia   Metabolism / endocrine: BMI: Body mass index is 26.89 kg/m.   1/15 labs BMP WNL UDS negative CBC Hgb 15.4 APAP and sal level neg   1/18 labs Lipid panel, chol 236, LDL 160  A1c 5.1 CBC Hgb 14.9  Patient's care was  discussed during the interdisciplinary team meeting every day during the hospitalization.  The patient denied having side effects to prescribed psychiatric medication.  Assessment  Gradually, patient started adjusting to milieu. The patient was evaluated each day by a clinical provider to ascertain response to treatment. Improvement was noted by the patient's report of decreasing symptoms, improved sleep and appetite, affect, medication tolerance, behavior, and participation in unit programming.  Patient was asked each day to complete a self inventory noting mood, mental status, pain, new symptoms, anxiety and concerns.    Symptoms were reported as significantly decreased or resolved completely by discharge.   On day of discharge,  the patient reports that their mood is stable. The patient denied having suicidal thoughts for more than 48 hours prior to discharge.  Patient denies having homicidal thoughts.  Patient denies having auditory hallucinations.  Patient denies any visual hallucinations or other symptoms of psychosis. The patient was motivated to continue taking medication with a goal of continued improvement in mental health.   The patient reports their target psychiatric symptoms of depression and anxiety responded well to the psychiatric medications, and the patient reports overall benefit other psychiatric hospitalization. Supportive psychotherapy was provided to the patient. The patient also participated in regular group therapy while hospitalized. Coping skills, problem solving as well as relaxation therapies were also part of the unit programming.  Labs were reviewed with the patient, and abnormal results were discussed with the patient.  The patient is able to verbalize their individual safety plan to this provider.  Behavioral Events: None  Restraints: None  Groups:Engaged  Medications Changes: Started adderall and increased to adderall 10 mg qAM and 5 mg at lunch  D/C  Medications: adderall 10  mg qAM and 5 mg at lunch. Lexapro 20 mg daily  Sleep  Sleep:Sleep: Good   Musculoskeletal: Strength & Muscle Tone: within normal limits Gait & Station: normal Patient leans: N/A  Presentation  General Appearance:Appropriate for Environment, Casual, Fairly Groomed Eye Contact:Good Speech:Clear and Coherent, Normal Rate Volume:Increased Handedness:Right  Mood and Affect  Mood:Euthymic Affect:Appropriate, Congruent, Full Range  Thought Process  Thought Process:Coherent, Goal Directed, Linear Descriptions of Associations:Intact  Thought Content Suicidal Thoughts:Suicidal Thoughts: No Homicidal Thoughts:Homicidal Thoughts: No Hallucinations:Hallucinations: None Ideas of Reference:None Thought Content:Logical, Rumination, Perseveration  Sensorium  Memory:Immediate Fair Judgment:Fair Insight:Shallow  Executive Functions  Orientation:Full (Time, Place and Person) Language:Good Concentration:Fair [place] of Knowledge:Good  Psychomotor Activity  Psychomotor Activity:Psychomotor Activity: Normal  Assets  Assets:Communication Skills, Desire for Improvement, Housing, Resilience, Social Support  Sleep  Quality:Good  Physical Exam Vitals and nursing note reviewed.  Constitutional:      General: She is awake. She is not in acute distress.    Appearance: She is not ill-appearing or diaphoretic.  HENT:     Head: Normocephalic.  Pulmonary:     Effort: Pulmonary effort is normal. No respiratory distress.  Neurological:     Mental Status: She is alert.      Review of Systems  Constitutional:  Negative for malaise/fatigue.  Respiratory:  Negative for shortness of breath.   Cardiovascular:  Negative for chest pain.  Gastrointestinal:  Positive for nausea. Negative for vomiting.  Neurological:  Positive for headaches. Negative for dizziness.   Blood pressure 96/73, pulse (!) 109, temperature 97.6 F (36.4 C),  temperature source Oral, resp. rate 16, height 5\' 10"  (1.778 m), weight 85 kg, SpO2 98 %. Body mass index is 26.89 kg/m.  Mental Status Per Nursing Assessment::   On Admission:  NA  Demographic Factors:  Adolescent or young adult and Caucasian  Loss Factors: Loss of significant relationship  Historical Factors: Impulsivity  Risk Reduction Factors:   Sense of responsibility to family, Living with another person, especially a relative, Positive social support, Positive therapeutic relationship, and Positive coping skills or problem solving skills  Continued Clinical Symptoms:  Previous Psychiatric Diagnoses and Treatments  Cognitive Features That Contribute To Risk:  Loss of executive function    Suicide Risk:  Minimal: No identifiable suicidal ideation.  Patients presenting with no risk factors but with morbid ruminations; may be classified as minimal risk based on the severity of the depressive symptoms   Follow-up Information     [employer] [facility] on 04/03/2022.   Why: You have a hospital follow up appointment on 04/03/22 at 11:00 am.  This appointment will be held in person.  Following this appointment, you will be scheduled for a clinical assessment, to obtain necessary therapy and medication management services. Contact information: [address] [place] [address] [phone]                 Discharge recommendations:    Activity: as tolerated  Diet: heart healthy  # It is recommended to the patient to continue psychiatric medications as prescribed, after discharge from the hospital.     # It is recommended to the patient to follow up with your outpatient psychiatric provider -instructions on appointment date, time, and address (location) are provided to you in discharge paperwork  # Follow-up with outpatient primary care doctor and other specialists -for management of chronic medical disease, including:  Asthma   # Testing:  Follow-up with outpatient provider for abnormal lab results:  TSH Lipid panel, chol 236, LDL 160    # It was discussed with the patient, the impact of alcohol, drugs, tobacco have been there overall psychiatric and medical wellbeing, and total abstinence from substance use was recommended to the patient.   # Prescriptions provided or sent directly to preferred pharmacy at discharge. Patient agreeable to plan. Given opportunity to ask questions. Appears to feel comfortable with discharge.    # In the event of worsening symptoms, the patient is instructed to call the crisis hotline, 911 and or go to the nearest ED for appropriate evaluation and treatment of symptoms. To follow-up with primary care provider for other medical issues, concerns and or health care needs  Patient agrees with D/C instructions and plan.   Total Time Spent in Direct Patient Care: See attending attestation.  Signed: Princess Bruins, DO Psychiatry Resident, PGY-2 [facility] 03/29/2022, 10:25 AM

## 2022-03-29 NOTE — Progress Notes (Signed)
   03/29/22 0900  Psych Admission Type (Psych Patients Only)  Admission Status Voluntary  Psychosocial Assessment  Patient Complaints None  Eye Contact Fair  Facial Expression Animated  Affect Appropriate to circumstance  Speech Logical/coherent  Interaction Assertive  Motor Activity Fidgety  Appearance/Hygiene Unremarkable  Behavior Characteristics Cooperative;Appropriate to situation  Mood Pleasant  Thought Process  Coherency WDL  Content WDL  Delusions None reported or observed  Perception WDL  Hallucination None reported or observed  Judgment WDL  Confusion None  Danger to Self  Current suicidal ideation? Denies  Agreement Not to Harm Self Yes  Description of Agreement verbal  Danger to Others  Danger to Others None reported or observed

## 2022-03-29 NOTE — Discharge Summary (Addendum)
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 appointment date, time, and address (location) are provided to you in discharge paperwork  # Follow-up with outpatient primary care doctor and other specialists -for management of chronic medical disease, including:  Asthma  # Testing: Follow-up with outpatient provider for abnormal lab results:  TSH Lipid panel, chol 236, LDL 160    # It was discussed with the patient, the impact of alcohol, drugs, tobacco have been there overall psychiatric and medical wellbeing, and total abstinence from substance use was recommended to the  patient.   # Prescriptions provided or sent directly to preferred pharmacy at discharge. Patient agreeable to plan. Given opportunity to ask questions. Appears to feel comfortable with discharge.    # In the event of worsening symptoms, the patient is instructed to call the crisis hotline, 911  and or go to the nearest ED for appropriate evaluation and treatment of symptoms. To follow-up with primary care provider for other medical issues, concerns and or health care needs  Total Time Spent in Direct Patient Care: See attending attestation.  Signed: Merrily Brittle, DO Psychiatry Resident, PGY-2 [facility] 03/29/2022, 10:18 AM

## 2022-03-29 NOTE — Progress Notes (Signed)
Pt discharged to lobby. Mom present to pick up patient.   Pt was stable and appreciative at that time. All papers and electronic prescriptions were given and valuables returned. Suicide safety plan completed and reviewed- copy given to pt- original in chart.Verbal understanding expressed. Denies SI/HI and A/VH. Pt given opportunity to express concerns and ask questions.

## 2022-03-29 NOTE — [facility] Group Notes (Signed)
Pt attended goals group 

## 2022-03-29 NOTE — Progress Notes (Signed)
   03/29/22 0100  Psych Admission Type (Psych Patients Only)  Admission Status Voluntary  Psychosocial Assessment  Patient Complaints Anxiety  Eye Contact Darting  Facial Expression Animated  Affect Appropriate to circumstance  Speech Logical/coherent  Interaction Assertive  Motor Activity Fidgety  Appearance/Hygiene Unremarkable  Behavior Characteristics Appropriate to situation;Cooperative  Mood Pleasant  Thought Process  Coherency WDL  Content WDL  Delusions None reported or observed  Perception WDL  Hallucination None reported or observed  Judgment WDL  Confusion None  Danger to Self  Current suicidal ideation? Denies  Agreement Not to Harm Self Yes  Description of Agreement verbal  Danger to Others  Danger to Others None reported or observed

## 2022-03-29 NOTE — Progress Notes (Signed)
  [facility] Adult Case Management Discharge Plan :  Will you be returning to the same living situation after discharge:  Yes,  with mother At discharge, do you have transportation home?: Yes,  mother Do you have the ability to pay for your medications: Yes,  has insurance  Release of information consent forms completed and in the chart;  Patient's signature needed at discharge.  Patient to Follow up at:  Follow-up Information     [place] on 04/03/2022.   Why: You have a hospital follow up appointment on 04/03/22 at 11:00 am.  This appointment will be held in person.  Following this appointment, you will be scheduled for a clinical assessment, to obtain necessary therapy and medication management services. Contact information: [place] [address] [phone]                 Next level of care provider has access to [place] and Suicide Prevention discussed: Yes,  Jessica Hebert     Has patient been referred to the Quitline?: Patient refused referral  Patient has been referred for addiction treatment: Pt. refused referral  Lubertha South, LCSW 03/29/2022, 10:33 AM

## 2022-07-07 ENCOUNTER — Ambulatory Visit (HOSPITAL_COMMUNITY): Payer: Self-pay

## 2022-07-07 ENCOUNTER — Ambulatory Visit
Admission: EM | Admit: 2022-07-07 | Discharge: 2022-07-07 | Disposition: A | Discharge status: Home / Self Care | Payer: Medicaid Other

## 2022-07-07 DIAGNOSIS — R110 Nausea: Secondary | ICD-10-CM

## 2022-07-07 DIAGNOSIS — G43909 Migraine, unspecified, not intractable, without status migrainosus: Secondary | ICD-10-CM

## 2022-07-07 MED ORDER — ONDANSETRON 4 MG PO TBDP: 4.0000 mg | ORAL_TABLET | Freq: Three times a day (TID) | ORAL | 0 refills | Status: DC | PRN

## 2022-07-07 NOTE — ED Provider Notes (Signed)
[facility]    CSN: [id number] Arrival date & time: 07/07/22  1135      History   Chief Complaint Chief Complaint  Patient presents with   Headache    HPI Jessica Hebert is a 22 y.o. female.  Patient presents with migraine headache intermittently for 3 days.  No falls or injury.  The headache feels like her usual migraine headache and is not the worst headache of her life.  The headache is associated with nausea, photosensitivity, and noise sensitivity.  Her headache improves with ibuprofen and is worse with activity.  Last ibuprofen taken at 1000 this morning.  No weakness, numbness, dizziness, fever, chest pain, shortness of breath, vomiting, or other symptoms.  Her medical history includes asthma, migraine headaches, allergies, depression, anxiety, panic disorder, bulimia, GERD, eczema.  The history is provided by the patient and medical records.    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   Generalized anxiety disorder 03/25/2022   Panic disorder 03/25/2022   MDD (major depressive disorder) 03/24/2022   Intentional overdose (HCC) 03/23/2022   Attention deficit hyperactivity disorder (ADHD) 07/24/2019   Allergic rhinitis due to allergen 07/24/2019   Overweight 07/24/2019   Intractable migraine without aura and without status migrainosus 05/09/2018   Acne 07/19/2016   Asthma 07/19/2016   Anxiety and depression 07/19/2016    Past Surgical History:  Procedure Laterality Date   ENDOSCOPIC CONCHA BULLOSA RESECTION Left 02/24/2018   Procedure: ENDOSCOPIC CONCHA BULLOSA REDUCTION VIA NASAL ENDOSCOPY;  Surgeon: Vernie Murders, MD;   Location: [facility];  Service: ENT;  Laterality: Left;   RHINOPLASTY  02/24/2018   septoplasty    SEPTOPLASTY Bilateral 02/24/2018   Procedure: SEPTOPLASTY;  Surgeon: Vernie Murders, MD;  Location: [facility];  Service: ENT;  Laterality: Bilateral;   SEPTOPLASTY Bilateral    TONSILLECTOMY     TONSILLECTOMY AND ADENOIDECTOMY N/A 05/27/2017   Procedure: TONSILLECTOMY AND ADENOIDECTOMY;  Surgeon: Vernie Murders, MD;  Location: [facility];  Service: ENT;  Laterality: N/A;   WISDOM TOOTH EXTRACTION  10/22/2017   four    OB History     Gravida  0   Para  0   Term  0   Preterm  0   AB  0   Living  0      SAB  0   IAB  0   Ectopic  0   Multiple  0   Live Births  0            Home Medications    Prior to Admission medications   Medication Sig Start Date End Date Taking? Authorizing Provider  amphetamine-dextroamphetamine (ADDERALL XR) 15 MG 24 hr capsule Take by mouth. 06/23/22 07/23/22 Yes [provider]  hydrOXYzine (ATARAX) 25 MG tablet Take by mouth. 04/08/22  Yes [provider]  ondansetron (ZOFRAN-ODT) 4 MG disintegrating tablet Take 1 tablet (4 mg total) by mouth every 8 (eight) hours as needed for nausea or vomiting. 07/07/22  Yes Mickie Bail, NP  albuterol (PROVENTIL HFA;VENTOLIN HFA) 108 (90 Base)  MCG/ACT inhaler Inhale 2 puffs into the lungs every 4 (four) hours as needed for wheezing or shortness of breath. 09/01/17   Domenick Gong, MD  amphetamine-dextroamphetamine (ADDERALL) 10 MG tablet Take 1 tablet (10 mg total) by mouth daily with breakfast. Patient not taking: Reported on 07/07/2022 03/30/22   Princess Bruins, DO  amphetamine-dextroamphetamine (ADDERALL) 5 MG tablet Take 1 tablet (5 mg total) by mouth daily at 2 PM. Patient not taking: Reported on 07/07/2022 03/29/22   Princess Bruins, DO  budesonide-formoterol [facility]) 160-4.5 MCG/ACT inhaler Inhale 2 puffs into the lungs 2 (two) times daily. 11/13/20    [provider]  cetirizine (ZYRTEC ALLERGY) 10 MG tablet Take 1 tablet (10 mg total) by mouth daily. 08/26/20 01/22/22  Domenick Gong, MD  escitalopram (LEXAPRO) 20 MG tablet Take 20 mg by mouth daily. 09/29/21   [provider]  etonogestrel-ethinyl estradiol (NUVARING) 0.12-0.015 MG/24HR vaginal ring INSERT 1 RING IN THE VAGINA AND LEAVE IN PLACE FOR 3 CONSECUTIVE WEEKS, THEN REMOVE FOR 1 WEEK AS DIRECTED. 11/08/18   Farrel Conners, CNM  triamcinolone (NASACORT) 55 MCG/ACT AERO nasal inhaler Place into the nose.  10/03/18  [provider]    Family History Family History  Problem Relation Age of Onset   Breast cancer Maternal Grandmother 53   Hypertension Maternal Grandmother    Heart disease Maternal Grandmother    Stroke Maternal Grandmother 79   Depression Mother    Lung cancer Maternal Uncle    Drug abuse Father     Social History Social History   Tobacco Use   Smoking status: Former    Types: Cigarettes   Smokeless tobacco: Former  Building services engineer Use: Never used  Substance Use Topics   Alcohol use: Yes    Comment: social   Drug use: Yes    Types: Marijuana     Allergies   Grapeseed extract [nutritional supplements], Egg-derived products, Other, and Pollen extract   Review of Systems Review of Systems  Constitutional:  Negative for chills and fever.  Eyes:  Positive for photophobia. Negative for pain, discharge and visual disturbance.  Respiratory:  Negative for cough and shortness of breath.   Cardiovascular:  Negative for chest pain and palpitations.  Gastrointestinal:  Positive for nausea. Negative for abdominal pain, diarrhea and vomiting.  Neurological:  Positive for headaches. Negative for dizziness, syncope, weakness and numbness.  All other systems reviewed and are negative.    Physical Exam Triage Vital Signs ED Triage Vitals  Enc Vitals Group     BP 07/07/22 1201 125/85     Pulse Rate 07/07/22 1156 95      Resp 07/07/22 1156 18     Temp 07/07/22 1156 98.2 F (36.8 C)     Temp src --      SpO2 07/07/22 1156 98 %     Weight --      Height --      Head Circumference --      Peak Flow --      Pain Score 07/07/22 1157 5     Pain Loc --      Pain Edu? --      Excl. in GC? --    No data found.  Updated Vital Signs BP 125/85   Pulse 95   Temp 98.2 F (36.8 C)   Resp 18   SpO2 98%   Visual Acuity Right Eye Distance:   Left Eye Distance:   Bilateral Distance:    Right  Eye Near:   Left Eye Near:    Bilateral Near:     Physical Exam Vitals and nursing note reviewed.  Constitutional:      General: She is not in acute distress.    Appearance: She is well-developed. She is not ill-appearing.  HENT:     Right Ear: Tympanic membrane normal.     Left Ear: Tympanic membrane normal.     Nose: Nose normal.     Mouth/Throat:     Mouth: Mucous membranes are moist.     Pharynx: Oropharynx is clear.  Eyes:     Pupils: Pupils are equal, round, and reactive to light.  Cardiovascular:     Rate and Rhythm: Normal rate and regular rhythm.     Heart sounds: Normal heart sounds.  Pulmonary:     Effort: Pulmonary effort is normal. No respiratory distress.     Breath sounds: Normal breath sounds.  Abdominal:     General: Bowel sounds are normal.     Palpations: Abdomen is soft.     Tenderness: There is no abdominal tenderness. There is no guarding or rebound.  Musculoskeletal:     Cervical back: Neck supple.  Skin:    General: Skin is warm and dry.  Neurological:     General: No focal deficit present.     Mental Status: She is alert and oriented to person, place, and time.     Sensory: No sensory deficit.     Motor: No weakness.     Gait: Gait normal.  Psychiatric:        Mood and Affect: Mood normal.        Behavior: Behavior normal.      UC Treatments / Results  Labs (all labs ordered are listed, but only abnormal results are displayed) Labs Reviewed - No data to  display  EKG   Radiology No results found.  Procedures Procedures (including critical care time)  Medications Ordered in UC Medications - No data to display  Initial Impression / Assessment and Plan / UC Course  I have reviewed the triage vital signs and the nursing notes.  Pertinent labs & imaging results that were available during my care of the patient were reviewed by me and considered in my medical decision making (see chart for details).   Migraine headache, Nausea.  Afebrile, VSS.  Patient is well-appearing and her exam is reassuring.  Patient took ibuprofen this morning so we are not able to give her an injection of Toradol at this time.  Treating nausea with Zofran.  Work note provided per patient request.  Instructed patient to follow-up with her PCP if her symptoms are not improving.  ED precautions given.  Education provided on migraine headache.  Patient agrees to plan of care.   Final Clinical Impressions(s) / UC Diagnoses   Final diagnoses:  Migraine without status migrainosus, not intractable, unspecified migraine type  Nausea without vomiting     Discharge Instructions      Take Tylenol or ibuprofen as needed for discomfort.  Take the antinausea medication as directed.    Follow up with your primary care provider if your symptoms are not improving.        ED Prescriptions     Medication Sig Dispense Auth. Provider   ondansetron (ZOFRAN-ODT) 4 MG disintegrating tablet Take 1 tablet (4 mg total) by mouth every 8 (eight) hours as needed for nausea or vomiting. 20 tablet Mickie Bail, NP      PDMP  not reviewed this encounter.   Mickie Bail, NP 07/07/22 [phone]

## 2022-07-07 NOTE — ED Triage Notes (Signed)
Patient to Urgent Care with complaints of migraine that started on Friday. Relieved by ibuprofen. States that the pain returns behind her eyes when she wakes up/ worsens with activity.  Endorses light/ audio sensitivity.   Requests work note.

## 2022-07-07 NOTE — Discharge Instructions (Addendum)
Take Tylenol or ibuprofen as needed for discomfort.  Take the antinausea medication as directed.    Follow up with your primary care provider if your symptoms are not improving.

## 2022-08-14 ENCOUNTER — Ambulatory Visit: Payer: Medicaid Other

## 2022-09-12 ENCOUNTER — Other Ambulatory Visit: Payer: Self-pay

## 2022-09-12 ENCOUNTER — Emergency Department
Admission: EM | Admit: 2022-09-12 | Discharge: 2022-09-14 | Disposition: A | Discharge status: Home / Self Care | Payer: Self-pay | Attending: Emergency Medicine | Admitting: Emergency Medicine

## 2022-09-12 DIAGNOSIS — E663 Overweight: Secondary | ICD-10-CM | POA: Diagnosis present

## 2022-09-12 DIAGNOSIS — F411 Generalized anxiety disorder: Secondary | ICD-10-CM | POA: Diagnosis present

## 2022-09-12 DIAGNOSIS — F329 Major depressive disorder, single episode, unspecified: Secondary | ICD-10-CM | POA: Diagnosis present

## 2022-09-12 DIAGNOSIS — J45909 Unspecified asthma, uncomplicated: Secondary | ICD-10-CM | POA: Insufficient documentation

## 2022-09-12 DIAGNOSIS — F419 Anxiety disorder, unspecified: Secondary | ICD-10-CM | POA: Diagnosis present

## 2022-09-12 DIAGNOSIS — F32A Depression, unspecified: Secondary | ICD-10-CM | POA: Diagnosis present

## 2022-09-12 DIAGNOSIS — T450X2A Poisoning by antiallergic and antiemetic drugs, intentional self-harm, initial encounter: Secondary | ICD-10-CM | POA: Insufficient documentation

## 2022-09-12 DIAGNOSIS — Z87891 Personal history of nicotine dependence: Secondary | ICD-10-CM | POA: Insufficient documentation

## 2022-09-12 DIAGNOSIS — T50902A Poisoning by unspecified drugs, medicaments and biological substances, intentional self-harm, initial encounter: Secondary | ICD-10-CM | POA: Diagnosis present

## 2022-09-12 DIAGNOSIS — F410 Panic disorder [episodic paroxysmal anxiety] without agoraphobia: Secondary | ICD-10-CM | POA: Diagnosis present

## 2022-09-12 DIAGNOSIS — T1491XA Suicide attempt, initial encounter: Secondary | ICD-10-CM

## 2022-09-12 LAB — URINE DRUG SCREEN, QUALITATIVE (ARMC ONLY)
Amphetamines, Ur Screen: NOT DETECTED
Barbiturates, Ur Screen: NOT DETECTED
Benzodiazepine, Ur Scrn: NOT DETECTED
Cannabinoid 50 Ng, Ur ~~LOC~~: NOT DETECTED
Cocaine Metabolite,Ur ~~LOC~~: NOT DETECTED
MDMA (Ecstasy)Ur Screen: NOT DETECTED
Methadone Scn, Ur: NOT DETECTED
Opiate, Ur Screen: NOT DETECTED
Phencyclidine (PCP) Ur S: NOT DETECTED
Tricyclic, Ur Screen: NOT DETECTED

## 2022-09-12 LAB — CBC
HCT: 43.1 % (ref 36.0–46.0)
Hemoglobin: 14.8 g/dL (ref 12.0–15.0)
MCH: 30.5 pg (ref 26.0–34.0)
MCHC: 34.3 g/dL (ref 30.0–36.0)
MCV: 88.9 fL (ref 80.0–100.0)
Platelets: 265 10*3/uL (ref 150–400)
RBC: 4.85 MIL/uL (ref 3.87–5.11)
RDW: 12.3 % (ref 11.5–15.5)
WBC: 6.4 10*3/uL (ref 4.0–10.5)
nRBC: 0 % (ref 0.0–0.2)

## 2022-09-12 LAB — ACETAMINOPHEN LEVEL: Acetaminophen (Tylenol), Serum: <10 ug/mL — ABNORMAL LOW (ref 10–30)

## 2022-09-12 LAB — COMPREHENSIVE METABOLIC PANEL
ALT: 13 U/L (ref 0–44)
AST: 15 U/L (ref 15–41)
Albumin: 3.5 g/dL (ref 3.5–5.0)
Alkaline Phosphatase: 47 U/L (ref 38–126)
Anion gap: 8 (ref 5–15)
BUN: 7 mg/dL (ref 6–20)
CO2: 22 mmol/L (ref 22–32)
Calcium: 8.6 mg/dL — ABNORMAL LOW (ref 8.9–10.3)
Chloride: 112 mmol/L — ABNORMAL HIGH (ref 98–111)
Creatinine, Ser: 0.92 mg/dL (ref 0.44–1.00)
GFR, Estimated: >60 mL/min (ref >60)
Glucose, Bld: 92 mg/dL (ref 70–99)
Potassium: 3.4 mmol/L — ABNORMAL LOW (ref 3.5–5.1)
Sodium: 142 mmol/L (ref 135–145)
Total Bilirubin: 0.3 mg/dL (ref 0.3–1.2)
Total Protein: 6.9 g/dL (ref 6.5–8.1)

## 2022-09-12 LAB — POC URINE PREG, ED: Preg Test, Ur: NEGATIVE

## 2022-09-12 LAB — ETHANOL: Alcohol, Ethyl (B): 120 mg/dL — ABNORMAL HIGH (ref <10)

## 2022-09-12 LAB — SALICYLATE LEVEL: Salicylate Lvl: <7 mg/dL — ABNORMAL LOW (ref 7.0–30.0)

## 2022-09-12 NOTE — ED Provider Notes (Signed)
[facility] Provider Note   Event Date/Time   First MD Initiated Contact with Patient 09/12/22 2142     (approximate) History  Drug Overdose  HPI SUHANI CHELF is a 22 y.o. female with a past medical history of depression who presents after an intentional ingestion of 10-15 tablets of hydroxyzine at approximately 2030 today and and admitted suicide attempt.  Patient states she has increasing depression as a reason for her suicide attempt.  Patient denies any homicidal ideation or auditory/visual hallucinations ROS: Patient currently denies any vision changes, tinnitus, difficulty speaking, facial droop, sore throat, chest pain, shortness of breath, abdominal pain, nausea/vomiting/diarrhea, dysuria, or weakness/numbness/paresthesias in any extremity   Physical Exam  Triage Vital Signs: ED Triage Vitals  Enc Vitals Group     BP 09/12/22 2152 126/78     Pulse Rate 09/12/22 2152 91     Resp 09/12/22 2152 (!) 21     Temp 09/12/22 2152 (!) 97.4 F (36.3 C)     Temp Source 09/12/22 2152 Oral     SpO2 09/12/22 2152 99 %     Weight 09/12/22 2146 180 lb (81.6 kg)     Height 09/12/22 2146 5\' 9"  (1.753 m)     Head Circumference --      Peak Flow --      Pain Score 09/12/22 2145 0     Pain Loc --      Pain Edu? --      Excl. in GC? --    Most recent vital signs: Vitals:   09/12/22 2152  BP: 126/78  Pulse: 91  Resp: (!) 21  Temp: (!) 97.4 F (36.3 C)  SpO2: 99%   General: Awake, oriented x4. CV:  Good peripheral perfusion.  Resp:  Normal effort.  Abd:  No distention.  Other:  Young adult overweight Caucasian female laying in bed in no acute distress ED Results / Procedures / Treatments  Labs (all labs ordered are listed, but only abnormal results are displayed) Labs Reviewed  COMPREHENSIVE METABOLIC PANEL - Abnormal; Notable for the following components:      Result Value   Potassium 3.4 (*)    Chloride 112 (*)    Calcium 8.6 (*)    All other  components within normal limits  ETHANOL - Abnormal; Notable for the following components:   Alcohol, Ethyl (B) 120 (*)    All other components within normal limits  SALICYLATE LEVEL - Abnormal; Notable for the following components:   Salicylate Lvl <7.0 (*)    All other components within normal limits  ACETAMINOPHEN LEVEL - Abnormal; Notable for the following components:   Acetaminophen (Tylenol), Serum <10 (*)    All other components within normal limits  CBC  URINE DRUG SCREEN, QUALITATIVE ([facility] ONLY)  POC URINE PREG, ED  PROCEDURES: Critical Care performed: No .1-3 Lead EKG Interpretation  Performed by: Merwyn Katos, MD Authorized by: Merwyn Katos, MD     Interpretation: normal     ECG rate:  71   ECG rate assessment: normal     Rhythm: sinus rhythm     Ectopy: none     Conduction: normal    MEDICATIONS ORDERED IN ED: Medications - No data to display IMPRESSION / MDM / ASSESSMENT AND PLAN / ED COURSE  I reviewed the triage vital signs and the nursing notes.  The patient is on the cardiac monitor to evaluate for evidence of arrhythmia and/or significant heart rate changes. Patient's presentation is most consistent with acute presentation with potential threat to life or bodily function. Thoughts are linear and organized, and patient has no AH, VH, or HI. Prior suicide attempt by overdose Prior Psychiatric Hospitalizations: Multiple  Clinically patient displays no overt toxidrome; they are well appearing Thoughts unlikely 2/2 anemia, hypothyroidism, infection, or ICH.  Consult: Psychiatry to evaluate patient for potential hold for danger to self. Disposition: Plan admit to psychiatry for further management of symptoms.  Clinical Course as of 09/12/22 2338  Sat Sep 12, 2022  2257 Hydroxyzine 10-15 for overdose prior to arrival.   IVC for placement. Medically cleared at 2 am.  [SM]    Clinical Course User Index [SM] Corena Herter, MD    FINAL CLINICAL IMPRESSION(S) / ED DIAGNOSES   Final diagnoses:  Suicide attempt [facility])  Intentional overdose, initial encounter [facility])   Rx / DC Orders   ED Discharge Orders     None      Note:  This document was prepared using Dragon voice recognition software and may include unintentional dictation errors.   Merwyn Katos, MD 09/12/22 [phone]

## 2022-09-12 NOTE — ED Triage Notes (Signed)
Pt to ED via [facility] c/o intentional overdose. Pt to 10-15 of her hydroxyzines at around 8:15 tonight. Pt has done this before. Hx of anxiety and depression. States she feeling sad. Endorses SI. Denies HI and hallucinations.   135/84 98%RA 90HR [id number]

## 2022-09-12 NOTE — ED Notes (Signed)
Pt dressed out into hospital scrubs by Dahlia Client RN and paige NT. Pt calm and cooperative. 2 brown sandals 1 gray shirt' 1 gray shorts 1 cellphone 1 charger and charger block
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
## 2022-09-12 NOTE — ED Notes (Signed)
Pt given sandwich tray 

## 2022-09-12 NOTE — ED Notes (Signed)
IVC 
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
## 2022-09-13 LAB — ACETAMINOPHEN LEVEL: Acetaminophen (Tylenol), Serum: <10 ug/mL — ABNORMAL LOW (ref 10–30)

## 2022-09-13 MED ORDER — ESCITALOPRAM OXALATE 10 MG PO TABS
20.0000 mg | ORAL_TABLET | Freq: Every day | ORAL | Status: DC
  Administered 2022-09-14: 20 mg via ORAL
  Filled 2022-09-13: qty 2

## 2022-09-13 MED ORDER — DOXYLAMINE SUCCINATE (SLEEP) 25 MG PO TABS
25.0000 mg | ORAL_TABLET | Freq: Every evening | ORAL | Status: DC | PRN
  Administered 2022-09-13: 25 mg via ORAL
  Filled 2022-09-13 (×2): qty 1

## 2022-09-13 MED ORDER — HYDROXYZINE HCL 25 MG PO TABS: 25.0000 mg | ORAL_TABLET | Freq: Every day | ORAL | Status: DC | PRN

## 2022-09-13 MED ORDER — AMPHETAMINE-DEXTROAMPHET ER 5 MG PO CP24: 15.0000 mg | ORAL_CAPSULE | Freq: Every day | ORAL | Status: DC

## 2022-09-13 NOTE — ED Notes (Signed)
Pt tearful stating that someone came in at 0400 this morning and told her that she was medically cleared. PT thought that it meant that she could go home. Rn explained that there is a difference between Medically cleared and Psychiatrically cleared. RN explained that we are making sure she has all of the resources outside of the hospital set up prior to discharge. No note seen from Psychiatry. Pt states that she is supposed to work at 1600 today. Consulting civil engineer notified.

## 2022-09-13 NOTE — ED Provider Notes (Signed)
Care assumed of patient from outgoing provider.  See their note for initial history, exam and plan.  Clinical Course as of 09/13/22 0428  Sat Sep 12, 2022  2257 Hydroxyzine 10-15 for overdose prior to arrival.   IVC for placement. Medically cleared at 2 am.  [SM]    Clinical Course User Index [SM] Corena Herter, MD   Repeat Tylenol level was undetectable.  Patient medically cleared.  Plan for evaluation by psychiatry.   Corena Herter, MD 09/13/22 [phone]

## 2022-09-13 NOTE — ED Notes (Signed)
Family updated as to patient's status over the phone with pt's permission.

## 2022-09-13 NOTE — ED Notes (Signed)
This tech obtained vital signs on pt.  

## 2022-09-13 NOTE — ED Notes (Signed)
Breakfast tray given. °

## 2022-09-13 NOTE — ED Notes (Signed)
Hospital meal provided, pt tolerated w/o complaints.  Waste discarded appropriately.  

## 2022-09-13 NOTE — BH Assessment (Signed)
Patient is to be admitted to [facility] by Psychiatric Nurse Practitioner Lerry Liner.  Attending Physician will be Dr. Marlou Porch.   Patient has been assigned to room 302, by [facility] Charge Nurse [place].   Intake Paper Work has been signed and placed on patient chart.  ER staff is aware of the admission: Carlene, ER Secretary   Dr. Katrinka Blazing, ER MD  Morrie Sheldon, Patient's Nurse  Marylene Land, Patient Access.   Pt can be transported after 8:30 AM 09/14/22.

## 2022-09-13 NOTE — ED Notes (Signed)
IVC/pending psych inpatient when medically cleared 

## 2022-09-13 NOTE — ED Notes (Signed)
Pt given phone to call her work. Given water per request. Denies further needs at this time.

## 2022-09-13 NOTE — ED Notes (Signed)
Pt tearful, feeling anxious now that we have moved her into the hall, pt given cup of water, secure chat send to Dr. Roxan Hockey about ordering home meds and something to help rest and decrease anxiety.   Call placed to TTS earlier to see if patient could go downstairs, waiting to hear back at this time.

## 2022-09-14 ENCOUNTER — Other Ambulatory Visit: Payer: Self-pay

## 2022-09-14 ENCOUNTER — Inpatient Hospital Stay
Admission: AD | Admit: 2022-09-14 | Discharge: 2022-09-17 | DRG: 885 | Disposition: A | Discharge status: Home / Self Care | Payer: 59 | Source: Intra-hospital | Attending: Psychiatry | Admitting: Psychiatry

## 2022-09-14 ENCOUNTER — Encounter: Payer: Self-pay | Admitting: Psychiatric/Mental Health

## 2022-09-14 DIAGNOSIS — Z818 Family history of other mental and behavioral disorders: Secondary | ICD-10-CM | POA: Diagnosis not present

## 2022-09-14 DIAGNOSIS — F909 Attention-deficit hyperactivity disorder, unspecified type: Secondary | ICD-10-CM | POA: Diagnosis present

## 2022-09-14 DIAGNOSIS — Z7951 Long term (current) use of inhaled steroids: Secondary | ICD-10-CM | POA: Diagnosis not present

## 2022-09-14 DIAGNOSIS — Z8249 Family history of ischemic heart disease and other diseases of the circulatory system: Secondary | ICD-10-CM

## 2022-09-14 DIAGNOSIS — Z8659 Personal history of other mental and behavioral disorders: Secondary | ICD-10-CM | POA: Diagnosis not present

## 2022-09-14 DIAGNOSIS — Z9151 Personal history of suicidal behavior: Secondary | ICD-10-CM

## 2022-09-14 DIAGNOSIS — F332 Major depressive disorder, recurrent severe without psychotic features: Secondary | ICD-10-CM | POA: Diagnosis present

## 2022-09-14 DIAGNOSIS — T43592A Poisoning by other antipsychotics and neuroleptics, intentional self-harm, initial encounter: Secondary | ICD-10-CM | POA: Diagnosis present

## 2022-09-14 DIAGNOSIS — Z87891 Personal history of nicotine dependence: Secondary | ICD-10-CM | POA: Diagnosis not present

## 2022-09-14 MED ORDER — HALOPERIDOL LACTATE 5 MG/ML IJ SOLN: 5.0000 mg | Freq: Three times a day (TID) | INTRAMUSCULAR | Status: DC | PRN

## 2022-09-14 MED ORDER — DIPHENHYDRAMINE HCL 50 MG/ML IJ SOLN: 50.0000 mg | Freq: Three times a day (TID) | INTRAMUSCULAR | Status: DC | PRN

## 2022-09-14 MED ORDER — MAGNESIUM HYDROXIDE 400 MG/5ML PO SUSP: 30.0000 mL | Freq: Every day | ORAL | Status: DC | PRN

## 2022-09-14 MED ORDER — LORAZEPAM 2 MG PO TABS: 2.0000 mg | ORAL_TABLET | Freq: Three times a day (TID) | ORAL | Status: DC | PRN

## 2022-09-14 MED ORDER — ALUM & MAG HYDROXIDE-SIMETH 200-200-20 MG/5ML PO SUSP
30.0000 mL | ORAL | Status: DC | PRN
  Administered 2022-09-16: 30 mL via ORAL
  Filled 2022-09-14: qty 30

## 2022-09-14 MED ORDER — ESCITALOPRAM OXALATE 10 MG PO TABS
20.0000 mg | ORAL_TABLET | Freq: Every day | ORAL | Status: DC
  Administered 2022-09-15 – 2022-09-17 (×3): 20 mg via ORAL
  Filled 2022-09-14 (×3): qty 2

## 2022-09-14 MED ORDER — HALOPERIDOL 5 MG PO TABS: 5.0000 mg | ORAL_TABLET | Freq: Three times a day (TID) | ORAL | Status: DC | PRN

## 2022-09-14 MED ORDER — HYDROXYZINE HCL 25 MG PO TABS
25.0000 mg | ORAL_TABLET | Freq: Three times a day (TID) | ORAL | Status: DC | PRN
  Filled 2022-09-14 (×2): qty 1

## 2022-09-14 MED ORDER — DIPHENHYDRAMINE HCL 25 MG PO CAPS
50.0000 mg | ORAL_CAPSULE | Freq: Three times a day (TID) | ORAL | Status: DC | PRN
  Administered 2022-09-15 – 2022-09-16 (×2): 50 mg via ORAL
  Filled 2022-09-14 (×2): qty 2

## 2022-09-14 MED ORDER — TRAZODONE HCL 50 MG PO TABS
50.0000 mg | ORAL_TABLET | Freq: Every evening | ORAL | Status: DC | PRN
  Administered 2022-09-14: 50 mg via ORAL
  Filled 2022-09-14: qty 1

## 2022-09-14 MED ORDER — LORAZEPAM 2 MG/ML IJ SOLN: 2.0000 mg | Freq: Three times a day (TID) | INTRAMUSCULAR | Status: DC | PRN

## 2022-09-14 MED ORDER — ACETAMINOPHEN 325 MG PO TABS: 650.0000 mg | ORAL_TABLET | Freq: Four times a day (QID) | ORAL | Status: DC | PRN

## 2022-09-14 NOTE — Group Note (Signed)
Date:  09/14/2022 Time:  3:37 PM  Group Topic/Focus:  Activity?Outdoor Recreation    Participation Level:  Did Not Attend   Lynelle Smoke [facility] 09/14/2022, 3:37 PM

## 2022-09-14 NOTE — Group Note (Signed)
Date:  09/14/2022 Time:  9:55 PM  Group Topic/Focus:  Wrap-Up Group:   The focus of this group is to help patients review their daily goal of treatment and discuss progress on daily workbooks.    Participation Level:  Active  Participation Quality:  Appropriate  Affect:  Appropriate  Cognitive:  Alert  Insight: Improving and Lacking  Engagement in Group:  Limited  Modes of Intervention:  Discussion  Additional Comments:     Maglione,Leather Estis E 09/14/2022, 9:55 PM

## 2022-09-14 NOTE — Tx Team (Signed)
Initial Treatment Plan 09/14/2022 2:24 PM [name] [id number]    PATIENT STRESSORS: Loss of grandmom, went to nursing home   Traumatic event     PATIENT STRENGTHS: Ability for insight  Active sense of humor  Average or above average intelligence  Capable of independent living  Communication skills  General fund of knowledge  Motivation for treatment/growth  Physical Health  Religious Affiliation  Special hobby/interest  Supportive family/friends    PATIENT IDENTIFIED PROBLEMS: OD  Impulsivity                   DISCHARGE CRITERIA:  Ability to meet basic life and health needs Improved stabilization in mood, thinking, and/or behavior Need for constant or close observation no longer present Reduction of life-threatening or endangering symptoms to within safe limits  PRELIMINARY DISCHARGE PLAN: Outpatient therapy Return to previous living arrangement  PATIENT/FAMILY INVOLVEMENT: This treatment plan has been presented to and reviewed with the patient, Jessica Hebert, The patient has been given the opportunity to ask questions and make suggestions.  Jessica Gamma, RN 09/14/2022, 2:24 PM

## 2022-09-14 NOTE — Group Note (Signed)
LCSW Group Therapy Note  Group Date: 09/14/2022 Start Time: 1315 End Time: 1400   Type of Therapy and Topic:  Group Therapy: Anger Cues and Responses  Participation Level:  Active   Description of Group:   In this group, patients learned how to recognize the physical, cognitive, emotional, and behavioral responses they have to anger-provoking situations.  They identified a recent time they became angry and how they reacted.  They analyzed how their reaction was possibly beneficial and how it was possibly unhelpful.  The group discussed a variety of healthier coping skills that could help with such a situation in the future.  Focus was placed on how helpful it is to recognize the underlying emotions to our anger, because working on those can lead to a more permanent solution as well as our ability to focus on the important rather than the urgent.  Therapeutic Goals: Patients will remember their last incident of anger and how they felt emotionally and physically, what their thoughts were at the time, and how they behaved. Patients will identify how their behavior at that time worked for them, as well as how it worked against them. Patients will explore possible new behaviors to use in future anger situations. Patients will learn that anger itself is normal and cannot be eliminated, and that healthier reactions can assist with resolving conflict rather than worsening situations.  Summary of Patient Progress:   Patient was active during the group. She shared a recent occurrence wherein feelings led to anger. She demonstrated fair insight into the subject matter, was respectful of peers, and participated throughout the entire session.  Patient was able to identify her coping skills to anger.    Therapeutic Modalities:   Cognitive Behavioral Therapy    Harden Mo, LCSWA 09/14/2022  2:22 PM

## 2022-09-14 NOTE — Group Note (Signed)
Recreation Therapy Group Note   Group Topic:Coping Skills  Group Date: 09/14/2022 Start Time: 1000 End Time: 1050 Facilitators: Rosina Lowenstein, LRT, CTRS Location:  Craft Room  Group Description: Mind Map.  Patient was provided a blank template of a diagram with 32 blank boxes in a tiered system, branching from the center (similar to a bubble chart). LRT directed patients to label the middle of the diagram "Coping Skills". LRT and patients then came up with 8 different coping skills as examples. Pt were directed to record their coping skills in the 2nd tier boxes closest to the center.  Patients would then share their coping skills with the group as LRT wrote them out. LRT gave a handout of 100 different coping skills at the end of group.   Goal Area(s) Addressed: Patients will be able to define "coping skills". Patient will identify new coping skills.  Patient will increase communication.   Affect/Mood: N/A   Participation Level: Did not attend    Clinical Observations/Individualized Feedback: Jessica Hebert did not attend group due to not being on the unit yet.   Plan: Continue to engage patient in RT group sessions 2-3x/week.   Rosina Lowenstein, LRT, CTRS 09/14/2022 11:50 AM

## 2022-09-14 NOTE — ED Notes (Signed)
Security at bedside to assist with transport of patient from ED to [facility]
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
## 2022-09-15 NOTE — BHH Suicide Risk Assessment (Signed)
[facility] Admission Suicide Risk Assessment   Nursing information obtained from:  Patient Demographic factors:  Adolescent or young adult Current Mental Status:  NA (N/A) Loss Factors:  NA Historical Factors:  Prior suicide attempts Risk Reduction Factors:  Sense of responsibility to family, Employed, Positive social support  Total Time spent with patient: 45 minutes Principal Problem: MDD (major depressive disorder), recurrent episode, severe (HCC) Diagnosis:  Principal Problem:   MDD (major depressive disorder), recurrent episode, severe (HCC)   Jessica Hebert is a 22 y.o. female with a past medical history of depression who presents after an intentional ingestion of 10-15 tablets of hydroxyzine  and admitted suicide attempt.  Patient states she has increasing depression as a reason for her suicide attempt.   Continued Clinical Symptoms:  Alcohol Use Disorder Identification Test Final Score (AUDIT): 4 The "Alcohol Use Disorders Identification Test", Guidelines for Use in Primary Care, Second Edition.  World Science writer [facility]). Score between 0-7:  no or low risk or alcohol related problems. Score between 8-15:  moderate risk of alcohol related problems. Score between 16-19:  high risk of alcohol related problems. Score 20 or above:  warrants further diagnostic evaluation for alcohol dependence and treatment.   CLINICAL FACTORS:   Depression:   Impulsivity Insomnia Dysthymia Obsessive-Compulsive Disorder   Musculoskeletal: Strength & Muscle Tone: within normal limits Gait & Station: normal Patient leans: N/A  Psychiatric Specialty Exam:  Presentation  General Appearance:  Appropriate for Environment  Eye Contact: Good  Speech: Clear and Coherent  Speech Volume: Normal  Handedness: Right   Mood and Affect  Mood: Anxious; Depressed  Affect: Appropriate; Congruent   Thought Process  Thought Processes: Coherent  Descriptions of  Associations:Intact  Orientation:Full (Time, Place and Person)  Thought Content:Abstract Reasoning  History of Schizophrenia/Schizoaffective disorder:No  Duration of Psychotic Symptoms:No data recorded Hallucinations:Hallucinations: None  Ideas of Reference:None  Suicidal Thoughts:Suicidal Thoughts: Yes, Passive SI Active Intent and/or Plan: Without Intent  Homicidal Thoughts:Homicidal Thoughts: No   Sensorium  Memory: Immediate Good; Recent Good; Remote Good  Judgment: Impaired  Insight: Fair   Art therapist  Concentration: Good  Attention Span: Good  Recall: Good  Fund of Knowledge: Good  Language: Good   Psychomotor Activity  Psychomotor Activity: Psychomotor Activity: Normal   Assets  Assets: Communication Skills; Desire for Improvement; Housing; Physical Health; Social Support; Vocational/Educational   Sleep  Sleep: Sleep: Fair    Physical Exam: Physical Exam Constitutional:      Appearance: Normal appearance.  HENT:     Head: Normocephalic.  Eyes:     Pupils: Pupils are equal, round, and reactive to light.  Cardiovascular:     Rate and Rhythm: Normal rate.  Pulmonary:     Effort: Pulmonary effort is normal.  Musculoskeletal:        General: Normal range of motion.     Cervical back: Normal range of motion.  Skin:    General: Skin is warm and dry.  Neurological:     General: No focal deficit present.     Mental Status: She is alert and oriented to person, place, and time.    Review of Systems  Constitutional: Negative.   HENT: Negative.    Eyes: Negative.   Respiratory: Negative.    Cardiovascular: Negative.   Gastrointestinal: Negative.   Musculoskeletal: Negative.   Skin: Negative.   Psychiatric/Behavioral:  Positive for depression and suicidal ideas. The patient is nervous/anxious and has insomnia.    Blood pressure (!) 109/52, pulse 77,  temperature 98.2 F (36.8 C), temperature source Oral, resp. rate 17,  height 5\' 9"  (1.753 m), weight 114.8 kg, last menstrual period 08/13/2022, SpO2 97 %. Body mass index is 37.36 kg/m.   COGNITIVE FEATURES THAT CONTRIBUTE TO RISK:  None    SUICIDE RISK:   Moderate:  Frequent suicidal ideation with limited intensity, and duration, some specificity in terms of plans, no associated intent, good self-control, limited dysphoria/symptomatology, some risk factors present, and identifiable protective factors, including available and accessible social support.  PLAN OF CARE: Patient is admitted to locked unit under safety precaution.  Will consult social worker to get collateral from family.  Will continue to monitor patient for improvement in symptoms.  Patient was encouraged to attend group plan and coping strategies and safe discharge plan  I certify that inpatient services furnished can reasonably be expected to improve the patient's condition.   Lewanda Rife, MD 09/15/2022, 3:59 PM

## 2022-09-15 NOTE — Group Note (Signed)
Date:  09/15/2022 Time:  10:52 PM  Group Topic/Focus:  Recovery Goals:   The focus of this group is to identify appropriate goals for recovery and establish a plan to achieve them.    Participation Level:  Active  Participation Quality:  Appropriate  Affect:  Appropriate  Cognitive:  Appropriate  Insight: Good  Engagement in Group:  Engaged  Modes of Intervention:  Discussion  Additional Comments:    Lenore Cordia 09/15/2022, 10:52 PM

## 2022-09-15 NOTE — Group Note (Signed)
Date:  09/15/2022 Time:  11:10 AM  Group Topic/Focus:  Goals Group:   The focus of this group is to help patients establish daily goals to achieve during treatment and discuss how the patient can incorporate goal setting into their daily lives to aide in recovery.    Participation Level:  Active  Participation Quality:  Appropriate  Affect:  Appropriate  Cognitive:  Appropriate  Insight: Appropriate  Engagement in Group:  Engaged  Modes of Intervention:  Discussion, Education, and Support  Additional Comments:    Wilford Corner 09/15/2022, 11:10 AM

## 2022-09-15 NOTE — Group Note (Signed)
[facility] LCSW Group Therapy Note   Group Date: 09/15/2022 Start Time: 1300 End Time: 1340  Type of Therapy/Topic:  Group Therapy:  Feelings about Diagnosis  Participation Level:  Did Not Attend    Description of Group:    This group will allow patients to explore their thoughts and feelings about diagnoses they have received. Patients will be guided to explore their level of understanding and acceptance of these diagnoses. Facilitator will encourage patients to process their thoughts and feelings about the reactions of others to their diagnosis, and will guide patients in identifying ways to discuss their diagnosis with significant others in their lives. This group will be process-oriented, with patients participating in exploration of their own experiences as well as giving and receiving support and challenge from other group members.   Therapeutic Goals: 1. Patient will demonstrate understanding of diagnosis as evidence by identifying two or more symptoms of the disorder:  2. Patient will be able to express two feelings regarding the diagnosis 3. Patient will demonstrate ability to communicate their needs through discussion and/or role plays  Summary of Patient Progress: X   Therapeutic Modalities:   Cognitive Behavioral Therapy Brief Therapy Feelings Identification    Glenis Smoker, LCSW

## 2022-09-15 NOTE — Group Note (Signed)
Date:  09/15/2022 Time:  6:47 PM  Group Topic/Focus:  Self Care:   The focus of this group is to help patients understand the importance of self-care in order to improve or restore emotional, physical, spiritual, interpersonal, and financial health. Wellness Toolbox:   The focus of this group is to discuss various aspects of wellness, balancing those aspects and exploring ways to increase the ability to experience wellness.  Patients will create a wellness toolbox for use upon discharge.    Participation Level:  Active  Participation Quality:  Appropriate  Affect:  Appropriate  Cognitive:  Alert and Appropriate  Insight: Appropriate  Engagement in Group:  Engaged  Modes of Intervention:  Activity and Discussion  Additional Comments:    Doug Sou 09/15/2022, 6:47 PM
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
## 2022-09-15 NOTE — Group Note (Signed)
Recreation Therapy Group Note   Group Topic:Goal Setting  Group Date: 09/15/2022 Start Time: 1005 End Time: 1105 Facilitators: Rosina Lowenstein, LRT, CTRS Location:  Craft Room  Group Description: Scientist, research (physical sciences). Patients were given many different magazines, a glue stick, markers, and a piece of cardstock paper. LRT and pts discussed the importance of having goals in life. LRT and pts discussed the difference between short-term and long-term goals, as well as what a SMART goal is. LRT encouraged pts to create a vision board, with images they picked and then cut out with safety scissors from the magazine, for themselves, that capture their short and long-term goals. LRT encouraged pts to show and explain their vision board to the group. LRT offered to laminate vision board once dry and complete.   Goal Area(s) Addressed:  Patient will gain knowledge of short vs. long term goals.  Patient will identify goals for themselves. Patient will practice setting SMART goals. Patient will verbalize their goals to LRT and peers.   Affect/Mood: Appropriate   Participation Level: Active and Engaged   Participation Quality: Independent   Behavior: Appropriate and Cooperative   Speech/Thought Process: Coherent   Insight: Good   Judgement: Good   Modes of Intervention: Activity   Patient Response to Interventions:  Attentive, Engaged, Interested , and Receptive   Education Outcome:  Acknowledges education   Clinical Observations/Individualized Feedback: Jessica Hebert was active in their participation of session activities and group discussion. Pt identified "I want to be more mindful of my body. I want to continue being the best version of myself for my dog" as her goals. Pt interacted well with LRT and peers duration of session.   Plan: Continue to engage patient in RT group sessions 2-3x/week.   Rosina Lowenstein, LRT, CTRS 09/15/2022 11:57 AM

## 2022-09-15 NOTE — Plan of Care (Signed)
Assumed care of patient this am, she is A&O, pleasant and cooperative and reported sleeping well last evening. Pt continues to deny SI/HI, plan or intent and made a commitment to keep self safe and to reach out to staff when needed. Pt currently is attending group and is engaged in treatment; today's goal is to work on communication with others. Will continue to monitor for safety and educate her on her plan of care. Problem: Education: Goal: Knowledge of General Education information will improve Description: Including pain rating scale, medication(s)/side effects and non-pharmacologic comfort measures Outcome: Progressing   Problem: Health Behavior/Discharge Planning: Goal: Ability to manage health-related needs will improve Outcome: Progressing   Problem: Clinical Measurements: Goal: Ability to maintain clinical measurements within normal limits will improve Outcome: Progressing Goal: Will remain free from infection Outcome: Progressing Goal: Diagnostic test results will improve Outcome: Progressing Note: Low BP Goal: Cardiovascular complication will be avoided Outcome: Progressing   Problem: Activity: Goal: Risk for activity intolerance will decrease Outcome: Progressing   Problem: Nutrition: Goal: Adequate nutrition will be maintained Outcome: Progressing   Problem: Coping: Goal: Level of anxiety will decrease Outcome: Progressing   Problem: Elimination: Goal: Will not experience complications related to bowel motility Outcome: Progressing   Problem: Safety: Goal: Ability to remain free from injury will improve Outcome: Progressing

## 2022-09-15 NOTE — Plan of Care (Signed)

## 2022-09-15 NOTE — Progress Notes (Signed)
Nursing Shift Note:  1900-0700  Attended Evening Group: yes Medication Compliant:  refused trazodone Behavior: no concerns Sleep Quality: good Significant Changes: newly admitted patient   Some episodes or crying and heightened anxiety observed. Patient's goal is to improve communication skills.  Moderate symptoms of anxiety and depression endorsed.

## 2022-09-16 DIAGNOSIS — F332 Major depressive disorder, recurrent severe without psychotic features: Principal | ICD-10-CM

## 2022-09-16 NOTE — BH IP Treatment Plan (Signed)
Interdisciplinary Treatment and Diagnostic Plan Update  09/16/2022 Time of Session: 09:30 Jessica Hebert MRN: [id number]  Principal Diagnosis: MDD (major depressive disorder), recurrent episode, severe (HCC)  Secondary Diagnoses: Principal Problem:   MDD (major depressive disorder), recurrent episode, severe (HCC)   Current Medications:  Current Facility-Administered Medications  Medication Dose Route Frequency Provider Last Rate Last Admin   acetaminophen (TYLENOL) tablet 650 mg  650 mg Oral Q6H PRN Jearld Lesch, NP       alum & mag hydroxide-simeth (MAALOX/MYLANTA) 200-200-20 MG/5ML suspension 30 mL  30 mL Oral Q4H PRN Durwin Nora, Rashaun M, NP       diphenhydrAMINE (BENADRYL) capsule 50 mg  50 mg Oral TID PRN Jearld Lesch, NP   50 mg at 09/15/22 2117   Or   diphenhydrAMINE (BENADRYL) injection 50 mg  50 mg Intramuscular TID PRN Jearld Lesch, NP       escitalopram (LEXAPRO) tablet 20 mg  20 mg Oral Daily Sarina Ill, DO   20 mg at 09/16/22 1761   haloperidol (HALDOL) tablet 5 mg  5 mg Oral TID PRN Jearld Lesch, NP       Or   haloperidol lactate (HALDOL) injection 5 mg  5 mg Intramuscular TID PRN Jearld Lesch, NP       hydrOXYzine (ATARAX) tablet 25 mg  25 mg Oral TID PRN Jearld Lesch, NP       LORazepam (ATIVAN) tablet 2 mg  2 mg Oral TID PRN Jearld Lesch, NP       Or   LORazepam (ATIVAN) injection 2 mg  2 mg Intramuscular TID PRN Jearld Lesch, NP       magnesium hydroxide (MILK OF MAGNESIA) suspension 30 mL  30 mL Oral Daily PRN Jearld Lesch, NP       traZODone (DESYREL) tablet 50 mg  50 mg Oral QHS PRN Jearld Lesch, NP   50 mg at 09/14/22 2111   PTA Medications: Medications Prior to Admission  Medication Sig Dispense Refill Last Dose   amphetamine-dextroamphetamine (ADDERALL XR) 15 MG 24 hr capsule Take by mouth.      budesonide-formoterol (SYMBICORT) 160-4.5 MCG/ACT inhaler Inhale 2 puffs into the lungs 2 (two) times daily.       cetirizine (ZYRTEC ALLERGY) 10 MG tablet Take 1 tablet (10 mg total) by mouth daily. (Patient not taking: Reported on 09/13/2022) 30 tablet 0    escitalopram (LEXAPRO) 20 MG tablet Take 20 mg by mouth daily.      etonogestrel-ethinyl estradiol (NUVARING) 0.12-0.015 MG/24HR vaginal ring INSERT 1 RING IN THE VAGINA AND LEAVE IN PLACE FOR 3 CONSECUTIVE WEEKS, THEN REMOVE FOR 1 WEEK AS DIRECTED. 3 each 3    hydrOXYzine (ATARAX) 25 MG tablet Take 25 mg by mouth daily as needed for anxiety.       Patient Stressors: Loss of grandmom, went to nursing home   Traumatic event    Patient Strengths: Ability for insight  Active sense of humor  Average or above average intelligence  Capable of independent living  Communication skills  General fund of knowledge  Motivation for treatment/growth  Physical Health  Religious Affiliation  Special hobby/interest  Supportive family/friends   Treatment Modalities: Medication Management, Group therapy, Case management,  1 to 1 session with clinician, Psychoeducation, Recreational therapy.   Physician Treatment Plan for Primary Diagnosis: MDD (major depressive disorder), recurrent episode, severe (HCC) Long Term Goal(s): Improvement in symptoms so as ready for discharge  Short Term Goals: Ability to verbalize feelings will improve Ability to disclose and discuss suicidal ideas Ability to demonstrate self-control will improve Ability to identify and develop effective coping behaviors will improve Compliance with prescribed medications will improve Ability to identify triggers associated with substance abuse/mental health issues will improve  Medication Management: Evaluate patient's response, side effects, and tolerance of medication regimen.  Therapeutic Interventions: 1 to 1 sessions, Unit Group sessions and Medication administration.  Evaluation of Outcomes: Progressing  Physician Treatment Plan for Secondary Diagnosis: Principal Problem:   MDD  (major depressive disorder), recurrent episode, severe (HCC)  Long Term Goal(s): Improvement in symptoms so as ready for discharge   Short Term Goals: Ability to verbalize feelings will improve Ability to disclose and discuss suicidal ideas Ability to demonstrate self-control will improve Ability to identify and develop effective coping behaviors will improve Compliance with prescribed medications will improve Ability to identify triggers associated with substance abuse/mental health issues will improve     Medication Management: Evaluate patient's response, side effects, and tolerance of medication regimen.  Therapeutic Interventions: 1 to 1 sessions, Unit Group sessions and Medication administration.  Evaluation of Outcomes: Progressing   RN Treatment Plan for Primary Diagnosis: MDD (major depressive disorder), recurrent episode, severe (HCC) Long Term Goal(s): Knowledge of disease and therapeutic regimen to maintain health will improve  Short Term Goals: Ability to remain free from injury will improve, Ability to verbalize frustration and anger appropriately will improve, Ability to demonstrate self-control, Ability to participate in decision making will improve, Ability to verbalize feelings will improve, Ability to disclose and discuss suicidal ideas, Ability to identify and develop effective coping behaviors will improve, and Compliance with prescribed medications will improve  Medication Management: RN will administer medications as ordered by provider, will assess and evaluate patient's response and provide education to patient for prescribed medication. RN will report any adverse and/or side effects to prescribing provider.  Therapeutic Interventions: 1 on 1 counseling sessions, Psychoeducation, Medication administration, Evaluate responses to treatment, Monitor vital signs and CBGs as ordered, Perform/monitor CIWA, COWS, AIMS and Fall Risk screenings as ordered, Perform wound care  treatments as ordered.  Evaluation of Outcomes: Progressing   LCSW Treatment Plan for Primary Diagnosis: MDD (major depressive disorder), recurrent episode, severe (HCC) Long Term Goal(s): Safe transition to appropriate next level of care at discharge, Engage patient in therapeutic group addressing interpersonal concerns.  Short Term Goals: Engage patient in aftercare planning with referrals and resources, Increase social support, Increase ability to appropriately verbalize feelings, Increase emotional regulation, Facilitate acceptance of mental health diagnosis and concerns, Facilitate patient progression through stages of change regarding substance use diagnoses and concerns, Identify triggers associated with mental health/substance abuse issues, and Increase skills for wellness and recovery  Therapeutic Interventions: Assess for all discharge needs, 1 to 1 time with Social worker, Explore available resources and support systems, Assess for adequacy in community support network, Educate family and significant other(s) on suicide prevention, Complete Psychosocial Assessment, Interpersonal group therapy.  Evaluation of Outcomes: Progressing   Progress in Treatment: Attending groups: Yes. Participating in groups: Yes. Taking medication as prescribed: Yes. Toleration medication: Yes. Family/Significant other contact made: No, will contact:  mother, Tonita Phoenix. Patient understands diagnosis: Yes. Discussing patient identified problems/goals with staff: Yes. Medical problems stabilized or resolved: Yes. Denies suicidal/homicidal ideation: Yes. Issues/concerns per patient self-inventory: No. Other: none.  New problem(s) identified: No, Describe:  none identified.  New Short Term/Long Term Goal(s): medication management for mood stabilization; elimination of SI thoughts;  development of comprehensive mental wellness/sobriety plan.   Patient Goals: "To stop over thinking and make less  impulsive decisions."   Discharge Plan or Barriers: CSW will assist pt with development of an appropriate aftercare/discharge plan. Pt interested in having scheduled appointment with a therapist and psychiatrist.   Reason for Continuation of Hospitalization: Depression Medication stabilization Suicidal ideation  Estimated Length of Stay: 1-7 days  Last 3 [place] Suicide Severity Risk Score: Flowsheet Row Admission (Current) from 09/14/2022 in [facility] INPATIENT BEHAV[facility]AL MEDICINE ED from 09/12/2022 in [facility] Emergency Department at [facility] ED from 07/07/2022 in [facility] RISK CATEGORY High Risk High Risk No Risk       Last PHQ 2/9 Scores:     No data to display          Scribe for Treatment Team: Glenis Smoker, LCSW 09/16/2022 1:11 PM

## 2022-09-16 NOTE — Group Note (Signed)
Date:  09/16/2022 Time:  10:14 AM  Group Topic/Focus:  Goals Group:   The focus of this group is to help patients establish daily goals to achieve during treatment and discuss how the patient can incorporate goal setting into their daily lives to aide in recovery.    Participation Level:  Active  Participation Quality:  Appropriate  Affect:  Appropriate  Cognitive:  Appropriate  Insight: Appropriate  Engagement in Group:  Engaged  Modes of Intervention:  Discussion, Education, and Support  Additional Comments:    Erleen Egner Travis Prince Olivier 09/16/2022, 10:14 AM  

## 2022-09-16 NOTE — Group Note (Signed)
[facility] LCSW Group Therapy Note   Group Date: 09/16/2022 Start Time: 1330 End Time: 1430   Type of Therapy/Topic:  Group Therapy:  Emotion Regulation  Participation Level:  Active   Mood:  Description of Group:    The purpose of this group is to assist patients in learning to regulate negative emotions and experience positive emotions. Patients will be guided to discuss ways in which they have been vulnerable to their negative emotions. These vulnerabilities will be juxtaposed with experiences of positive emotions or situations, and patients challenged to use positive emotions to combat negative ones. Special emphasis will be placed on coping with negative emotions in conflict situations, and patients will process healthy conflict resolution skills.  Therapeutic Goals: Patient will identify two positive emotions or experiences to reflect on in order to balance out negative emotions:  Patient will label two or more emotions that they find the most difficult to experience:  Patient will be able to demonstrate positive conflict resolution skills through discussion or role plays:   Summary of Patient Progress: Patient ws present in group.  Patient was an active participant.  Patient was engaged.  Patient displayed fair insight.  Patient dicussed how her mother has been her trigger in the past.     Therapeutic Modalities:   Cognitive Behavioral Therapy Feelings Identification Dialectical Behavioral Therapy   Harden Mo, LCSW

## 2022-09-16 NOTE — Progress Notes (Signed)
[facility] MD Progress Note  09/16/2022 3:15 PM Jessica Hebert  MRN:  [id number] Subjective:  Pt is seen on rounds. She denies SI/HI, AVH, paranoia. She feels ready to discharge tomorrow. Spoke w/ pt's mother, [name] [phone], with pt. [name] agrees with plan for discharge tomorrow. [name] will be the one to pick pt up. Safety planning completed, including: Frequent conversations regarding unsafe thoughts. Locking/monitoring the use of all significant sharps, including knives, razor blades, pencil sharpener razors. If there is a firearm in the home, keeping the firearm unloaded, locking the firearm, locking the ammunition separately from the firearm, preventing access to the firearm and the ammunition. Pt and [name] deny firearm in home. Locking/monitoring the use of medications, including over-the-counter medications and supplements. Having a responsible person dispense medications until patient has strengthened coping skills. Room checks for sharps or other harmful objects. Secure all chemical substances that can be ingested or inhaled. Securing any ligature risks. Calling 911/EMS or going to the nearest emergency room for any worsening of condition. [name] denies safety concerns with pt discharge, states she feels pt is ready to come home  Principal Problem: MDD (major depressive disorder), recurrent episode, severe (HCC) Diagnosis: Principal Problem:   MDD (major depressive disorder), recurrent episode, severe (HCC)  Total Time spent with patient:  25 minutes  Past Psychiatric History: Patient reports history of depression and ADHD. She reports previous suicide attempt via overdose and reports she was admitted to [facility] and works a lot in the past. Patient takes Lexapro and Adderall for her symptoms. Patient also reports taking hydroxyzine as needed for anxiety  Past Medical History:  Past Medical History:  Diagnosis Date   Acne    Anxiety    Asthma    Depression    Eating disorder,  unspecified    Bulimia   Eczema    GERD (gastroesophageal reflux disease)    Headache    sinus related    Past Surgical History:  Procedure Laterality Date   ENDOSCOPIC CONCHA BULLOSA RESECTION Left 02/24/2018   Procedure: ENDOSCOPIC CONCHA BULLOSA REDUCTION VIA NASAL ENDOSCOPY;  Surgeon: Vernie Murders, MD;  Location: [facility];  Service: ENT;  Laterality: Left;   RHINOPLASTY  02/24/2018   septoplasty    SEPTOPLASTY Bilateral 02/24/2018   Procedure: SEPTOPLASTY;  Surgeon: Vernie Murders, MD;  Location: [facility];  Service: ENT;  Laterality: Bilateral;   SEPTOPLASTY Bilateral    TONSILLECTOMY     TONSILLECTOMY AND ADENOIDECTOMY N/A 05/27/2017   Procedure: TONSILLECTOMY AND ADENOIDECTOMY;  Surgeon: Vernie Murders, MD;  Location: [facility];  Service: ENT;  Laterality: N/A;   WISDOM TOOTH EXTRACTION  10/22/2017   [age]   Family History:  Family History  Problem Relation Age of Onset   Breast cancer Maternal Grandmother 53   Hypertension Maternal Grandmother    Heart disease Maternal Grandmother    Stroke Maternal Grandmother 70   Depression Mother    Lung cancer Maternal Uncle    Drug abuse Father    Family Psychiatric  History: None reported Social History:  Social History   Substance and Sexual Activity  Alcohol Use Yes   Comment: social     Social History   Substance and Sexual Activity  Drug Use Yes   Types: Marijuana    Social History   Socioeconomic History   Marital status: Single    Spouse name: Not on file   Number of children: 0   Years of education: 12   Highest education level:  Not on file  Occupational History   Occupation: student  Tobacco Use   Smoking status: Former    Types: Cigarettes   Smokeless tobacco: Former  Building services engineer Use: Never used  Substance and Sexual Activity   Alcohol use: Yes    Comment: social   Drug use: Yes    Types: Marijuana   Sexual activity: Yes    Partners: Male    Birth  control/protection: Other-see comments    Comment: Nuvaring   Other Topics Concern   Not on file  Social History Narrative   Not on file   Social Determinants of Health   Financial Resource Strain: Not on file  Food Insecurity: No Food Insecurity (09/14/2022)   Hunger Vital Sign    Worried About Running Out of Food in the Last Year: Never true    Ran Out of Food in the Last Year: Never true  Transportation Needs: No Transportation Needs (09/14/2022)   PRAPARE - Administrator, Civil Service (Medical): No    Lack of Transportation (Non-Medical): No  Physical Activity: Inactive (07/19/2017)   Exercise Vital Sign    Days of Exercise per Week: 0 days    Minutes of Exercise per Session: 0 min  Stress: No Stress Concern Present (07/19/2017)   [employer] of Occupational Health - Occupational Stress Questionnaire    Feeling of Stress : Not at all  Social Connections: Not on file   Additional Social History:                         Sleep: Fair  Appetite:  Fair  Current Medications: Current Facility-Administered Medications  Medication Dose Route Frequency Provider Last Rate Last Admin   acetaminophen (TYLENOL) tablet 650 mg  650 mg Oral Q6H PRN Jearld Lesch, NP       alum & mag hydroxide-simeth (MAALOX/MYLANTA) 200-200-20 MG/5ML suspension 30 mL  30 mL Oral Q4H PRN Dixon, Rashaun M, NP       diphenhydrAMINE (BENADRYL) capsule 50 mg  50 mg Oral TID PRN Jearld Lesch, NP   50 mg at 09/15/22 2117   Or   diphenhydrAMINE (BENADRYL) injection 50 mg  50 mg Intramuscular TID PRN Jearld Lesch, NP       escitalopram (LEXAPRO) tablet 20 mg  20 mg Oral Daily Sarina Ill, DO   20 mg at 09/16/22 5784   haloperidol (HALDOL) tablet 5 mg  5 mg Oral TID PRN Jearld Lesch, NP       Or   haloperidol lactate (HALDOL) injection 5 mg  5 mg Intramuscular TID PRN Jearld Lesch, NP       hydrOXYzine (ATARAX) tablet 25 mg  25 mg Oral TID PRN Jearld Lesch, NP       LORazepam (ATIVAN) tablet 2 mg  2 mg Oral TID PRN Jearld Lesch, NP       Or   LORazepam (ATIVAN) injection 2 mg  2 mg Intramuscular TID PRN Dixon, Rashaun M, NP       magnesium hydroxide (MILK OF MAGNESIA) suspension 30 mL  30 mL Oral Daily PRN Jearld Lesch, NP       traZODone (DESYREL) tablet 50 mg  50 mg Oral QHS PRN Jearld Lesch, NP   50 mg at 09/14/22 2111    Lab Results: No results found for this or any previous visit (from the past 48 hour(s)).  Blood Alcohol level:  Lab Results  Component Value Date   ETH 120 (H) 09/12/2022   ETH <10 03/23/2022    Metabolic Disorder Labs: Lab Results  Component Value Date   HGBA1C 5.1 03/26/2022   MPG 99.67 03/26/2022   No results found for: "PROLACTIN" Lab Results  Component Value Date   CHOL 236 (H) 03/26/2022   TRIG 141 03/26/2022   HDL 48 03/26/2022   CHOLHDL 4.9 03/26/2022   VLDL 28 03/26/2022   LDLCALC 160 (H) 03/26/2022   LDLCALC 115 (H) 11/08/2018    Physical Findings: AIMS:  , ,  ,  ,    CIWA:    COWS:     Musculoskeletal: Strength & Muscle Tone: within normal limits Gait & Station: normal Patient leans: N/A  Psychiatric Specialty Exam:  Presentation  General Appearance:  Appropriate for Environment  Eye Contact: Good  Speech: Clear and Coherent; Normal Rate  Speech Volume: Normal  Handedness: Right   Mood and Affect  Mood: Euthymic  Affect: Appropriate; Full Range   Thought Process  Thought Processes: Coherent; Goal Directed; Linear  Descriptions of Associations:Intact  Orientation:Full (Time, Place and Person)  Thought Content:Logical  History of Schizophrenia/Schizoaffective disorder:No  Duration of Psychotic Symptoms:No data recorded Hallucinations:Hallucinations: None  Ideas of Reference:None  Suicidal Thoughts:Suicidal Thoughts: No  Homicidal Thoughts:Homicidal Thoughts: No   Sensorium  Memory: Immediate Good; Recent Good; Remote  Good  Judgment: Intact  Insight: Fair   Art therapist  Concentration: Fair  Attention Span: Fair  Recall: Good  Fund of Knowledge: Good  Language: Good   Psychomotor Activity  Psychomotor Activity: Psychomotor Activity: Normal   Assets  Assets: Communication Skills; Desire for Improvement; Financial Resources/Insurance; Housing; Leisure Time; Resilience; Social Support; Vocational/Educational   Sleep  Sleep: Sleep: Fair    Physical Exam: Physical Exam Constitutional:      General: She is not in acute distress.    Appearance: She is not ill-appearing, toxic-appearing or diaphoretic.  Eyes:     General: No scleral icterus. Cardiovascular:     Rate and Rhythm: Normal rate.  Pulmonary:     Effort: Pulmonary effort is normal. No respiratory distress.  Neurological:     Mental Status: She is alert and oriented to person, place, and time.  Psychiatric:        Attention and Perception: Attention and perception normal.        Mood and Affect: Mood and affect normal.        Speech: Speech normal.        Behavior: Behavior normal. Behavior is cooperative.        Thought Content: Thought content normal.        Cognition and Memory: Cognition and memory normal.    Review of Systems  Constitutional:  Negative for chills and fever.  Respiratory:  Negative for shortness of breath.   Cardiovascular:  Negative for chest pain and palpitations.  Gastrointestinal:  Negative for abdominal pain.  Neurological:  Negative for headaches.   Blood pressure 124/73, pulse 83, temperature 99.3 F (37.4 C), temperature source Oral, resp. rate 17, height 5\' 9"  (1.753 m), weight 114.8 kg, last menstrual period 08/13/2022, SpO2 97 %. Body mass index is 37.36 kg/m.   Treatment Plan Summary: Daily contact with patient to assess and evaluate symptoms and progress in treatment, Medication management, and Plan    -Continue current medications -Expected discharge date is  tomorrow  Lauree Chandler, NP 09/16/2022, 3:15 PM

## 2022-09-16 NOTE — Progress Notes (Signed)
Patient pleasant and cooperative. Noted in dayroom laughing and talking with peers. Affect sad at times. Denies SI, HI, AVH. Requested benadryl for sleep. Given with good relief. Encouragement and support provided. Safety checks maintained. Medications given with good relief.

## 2022-09-16 NOTE — Group Note (Signed)
Recreation Therapy Group Note   Group Topic:Self-Esteem  Group Date: 09/16/2022 Start Time: 1010 End Time: 1105 Facilitators: Rosina Lowenstein, LRT, CTRS Location:  Craft Room  Group Description: My Strengths and Qualities. Patients and LRT discussed the importance of self-love and self-esteem. Pt completed a worksheet that helps them identify 24 different strengths and qualities about themselves. Pt encouraged to read aloud at least 3 off their sheet to the group. LRT and pts discussed how this can be applied to daily life post-discharge.  Pt's then played "Positive Affirmation Bingo" afterwards, with journals, activity books or stress balls as bingo prizes.  Goal Area(s) Addressed: Patient will identify positive qualities about themselves. Patient will learn new positive affirmations.  Patient will recite positive qualities and affirmations aloud to the group.  Patient will increase communication.   Affect/Mood: Appropriate   Participation Level: Active and Engaged   Participation Quality: Minimal Cues   Behavior: Calm and Cooperative   Speech/Thought Process: Coherent   Insight: Good   Judgement: Fair    Modes of Intervention: Activity and Worksheet   Patient Response to Interventions:  Attentive, Engaged, Interested , and Receptive   Education Outcome:  Acknowledges education   Clinical Observations/Individualized Feedback: Jessica Hebert was active in their participation of session activities and group discussion. Pt identified "I am good at making other people laugh, helping others and making them happy. Challenges I have overcome are my alcoholism, my grandma having a stroke and losing family." Pt made 3 different comments during group about not having her adderall and how she cannot wait to go home so that she can take it and how she is supposedly not being prescribed it here in the hospital. Pt required cue to focus on group topic and not being discharged and adderall. 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 Pt interacted well with LRT and peers duration of session.   Plan: Continue to engage patient in RT group sessions 2-3x/week.   Rosina Lowenstein, LRT, CTRS 09/16/2022 11:48 AM

## 2022-09-16 NOTE — Plan of Care (Signed)
Patient pleasant and cooperative on approach. Patient rated her depression 0/10 and anxiety 2/10. Patients goal for today " to work on not over thinking ." Patient denies SI,HI and AVH. Appropriate with staff & peers. Appetite good. Energy level fair. Attended groups. Support and encouragement given.

## 2022-09-16 NOTE — Group Note (Signed)
Date:  09/16/2022 Time:  6:31 PM  Group Topic/Focus:  Activity Group    Participation Level:  Did Not Attend   Jessica Hebert [name] 09/16/2022, 6:31 PM
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
## 2022-09-17 NOTE — [facility] Suicide Risk Assessment (Signed)
[facility] INPATIENT:  Family/Significant Other Suicide Prevention Education  Suicide Prevention Education:  Patient Refusal for Family/Significant Other Suicide Prevention Education: The patient Jessica Hebert has refused to provide written consent for family/significant other to be provided Family/Significant Other Suicide Prevention Education during admission and/or prior to discharge.  Physician notified.  SPE completed with pt, as pt refused to consent to family contact. SPI pamphlet provided to pt and pt was encouraged to share information with support network, ask questions, and talk about any concerns relating to SPE. Pt denies access to guns/firearms and verbalized understanding of information provided. Mobile Crisis information also provided to pt.  Glenis Smoker 09/17/2022, 1:57 PM

## 2022-09-17 NOTE — Progress Notes (Signed)
Suicide Risk Assessment  Discharge Assessment    [facility] Discharge Suicide Risk Assessment   Principal Problem: MDD (major depressive disorder), recurrent episode, severe (HCC) Discharge Diagnoses: Principal Problem:   MDD (major depressive disorder), recurrent episode, severe (HCC)   Total Time spent with patient: 30 minutes  Musculoskeletal: Strength & Muscle Tone: within normal limits Gait & Station: normal Patient leans: N/A  Psychiatric Specialty Exam  Presentation  General Appearance:  Appropriate for Environment  Eye Contact: Good  Speech: Clear and Coherent; Normal Rate  Speech Volume: Normal  Handedness: Right   Mood and Affect  Mood: Euthymic  Duration of Depression Symptoms: Greater than two weeks  Affect: Appropriate; Full Range   Thought Process  Thought Processes: Coherent; Goal Directed; Linear  Descriptions of Associations:Intact  Orientation:Full (Time, Place and Person)  Thought Content:Logical  History of Schizophrenia/Schizoaffective disorder:No  Duration of Psychotic Symptoms:No data recorded Hallucinations:Hallucinations: None  Ideas of Reference:None  Suicidal Thoughts:Suicidal Thoughts: No  Homicidal Thoughts:Homicidal Thoughts: No   Sensorium  Memory: Immediate Good; Recent Good; Remote Good  Judgment: Intact  Insight: Fair   Art therapist  Concentration: Fair  Attention Span: Fair  Recall: Good  Fund of Knowledge: Good  Language: Good   Psychomotor Activity  Psychomotor Activity: Psychomotor Activity: Normal   Assets  Assets: Communication Skills; Desire for Improvement; Financial Resources/Insurance; Housing; Leisure Time; Resilience; Social Support; Vocational/Educational   Sleep  Sleep: Sleep: Fair   Physical Exam: Physical Exam see discharge ROS see discharge Blood pressure 115/64, pulse 78, temperature 98.5 F (36.9 C), temperature source Oral, resp. rate 19, height 5'  9" (1.753 m), weight 114.8 kg, last menstrual period 08/13/2022, SpO2 98%. Body mass index is 37.36 kg/m.  Mental Status Per Nursing Assessment::   On Admission:  NA (N/A)  Demographic Factors:  Adolescent or young adult and Caucasian  Loss Factors: NA  Historical Factors: Prior suicide attempts, Family history of mental illness or substance abuse, and Impulsivity  Risk Reduction Factors:   Sense of responsibility to family, Living with another person, especially a relative, and Positive social support  Continued Clinical Symptoms:  Previous Psychiatric Diagnoses and Treatments  Cognitive Features That Contribute To Risk:  None    Suicide Risk:  Minimal: No identifiable suicidal ideation.  Patients presenting with no risk factors but with morbid ruminations; may be classified as minimal risk based on the severity of the depressive symptoms  Plan Of Care/Follow-up recommendations:  Patient to follow up with outpatient psychiatry and counseling.  Lauree Chandler, NP 09/17/2022, 7:09 AM

## 2022-09-17 NOTE — Progress Notes (Signed)
  [facility] Adult Case Management Discharge Plan :  Will you be returning to the same living situation after discharge:  Yes,  pt plans to return home upon discharge. At discharge, do you have transportation home?: Yes,  mother to provide transportation. Do you have the ability to pay for your medications: No.  Release of information consent forms completed and in the chart;  Patient's signature needed at discharge.  Patient to Follow up at:  Follow-up Information     [facility]. Go to.   Specialty: Behavioral Health Why: They have walk-in hours for both medication management and therapy. However, you will need to be seen by therapist first to complete initial intake assessment. As such the walk-in hours are Mondays, Wednesdays, and Thursdays beginning at 8AM. It is recommended that you arrive prior to 7:30AM, let them know you are seeking outpatient services, sign in, and they will begin seeing clients at 8AM. Thanks! Contact information: [phone] [place] [address] [phone]                Next level of care provider has access to [facility] Link:no  Safety Planning and Suicide Prevention discussed: Yes,  SPE completed with pt.     Has patient been referred to the Quitline?: Patient refused referral for treatment  Patient has been referred for addiction treatment: Patient refused referral for treatment.  Glenis Smoker, LCSW 09/17/2022, 1:57 PM

## 2022-09-17 NOTE — Plan of Care (Signed)
  Problem: Nutrition: Goal: Adequate nutrition will be maintained Outcome: Progressing   Problem: Coping: Goal: Level of anxiety will decrease Outcome: Progressing   

## 2022-09-17 NOTE — Group Note (Signed)
Recreation Therapy Group Note   Group Topic:Relaxation  Group Date: 09/17/2022 Start Time: 1000 End Time: 1040 Facilitators: Rosina Lowenstein, LRT, CTRS Location:  Craft Room  Group Description: PMR (Progressive Muscle Relaxation). LRT asks patients their current level of stress/anxiety from 1-10, with 10 being the highest. LRT educates patients on what PMR is and the benefits that come from it. Patients are asked to sit with their feet flat on the floor while sitting up and all the way back in their chair, if possible. LRT and pts follow a prompt through a speaker that requires you to tense and release different muscles in their body and focus on their breathing. During session, lights are off and soft music is being played. At the end of the prompt, LRT asks patients to rank their current levels of stress/anxiety from 1-10, 10 being the highest.   Goal Area(s) Addressed:  Patients will be able to describe progressive muscle relaxation.  Patient will practice using relaxation technique. Patient will identify a new coping skill.  Patient will follow multistep directions to reduce anxiety and stress.   Affect/Mood: Appropriate   Participation Level: Active and Engaged   Participation Quality: Independent   Behavior: Calm and Cooperative   Speech/Thought Process: Coherent   Insight: Good   Judgement: Good   Modes of Intervention: Activity   Patient Response to Interventions:  Attentive, Engaged, Interested , and Receptive   Education Outcome:  Acknowledges education   Clinical Observations/Individualized Feedback: Katilynn was active in their participation of session activities and group discussion. Pt identified that her anxiety was a 3 and stress was a 0 before the session. After the session, she rated her anxiety and stress a 0. Pt shared "this made me focus on my body more than my surroundings". Pt interacted well with LRT and peers duration of session.   Plan: Continue to  engage patient in RT group sessions 2-3x/week.   Rosina Lowenstein, LRT, CTRS 09/17/2022 11:08 AM

## 2022-09-17 NOTE — Progress Notes (Signed)
   09/16/22 2100  Psych Admission Type (Psych Patients Only)  Admission Status Involuntary  Psychosocial Assessment  Patient Complaints Malaise  Eye Contact Fair  Facial Expression Anxious  Affect Appropriate to circumstance  Speech Logical/coherent  Interaction Assertive  Motor Activity Slow  Appearance/Hygiene Unremarkable  Behavior Characteristics Cooperative;Appropriate to situation  Mood Pleasant  Thought Process  Coherency WDL  Content WDL  Delusions WDL  Perception WDL  Hallucination None reported or observed  Judgment Impaired  Confusion WDL  Danger to Self  Current suicidal ideation? Denies (Denies)  Agreement Not to Harm Self Yes  Description of Agreement verbal  Danger to Others  Danger to Others None reported or observed

## 2022-09-17 NOTE — Discharge Summary (Signed)
Physician Discharge Summary Note  Patient:  Jessica Hebert is an 22 y.o., female MRN:  [id number] DOB:  [birth year] Patient phone:  [phone] (home)  Patient address:   [address] [place] [address],  Total Time spent with patient: 30 minutes  Date of Admission:  09/14/2022 Date of Discharge: 09/17/22  Subjective: Pt seen on rounds. On assessment today, pt denies SI/HI, AVH, paranoia. Endorses euthymic mood. Denies feeling down, depressed, anxious, euphoric, other mood disturbances. Feels ready for discharge home. States she does not need any prescriptions as she has enough medication at home from outpatient provider.   Reason for Admission:  22 y/o female with past medical hx of depression, admitted to inpatient psychiatry after intentional ingestion of 10-15 tablets of hydroxyzine as suicide attempt.  Principal Problem: MDD (major depressive disorder), recurrent episode, severe (HCC) Discharge Diagnoses: Principal Problem:   MDD (major depressive disorder), recurrent episode, severe (HCC)   Past Psychiatric History: Pt reports past history of depression and ADHD.  Past Medical History:  Past Medical History:  Diagnosis Date   Acne    Anxiety    Asthma    Depression    Eating disorder, unspecified    Bulimia   Eczema    GERD (gastroesophageal reflux disease)    Headache    sinus related    Past Surgical History:  Procedure Laterality Date   ENDOSCOPIC CONCHA BULLOSA RESECTION Left 02/24/2018   Procedure: ENDOSCOPIC CONCHA BULLOSA REDUCTION VIA NASAL ENDOSCOPY;  Surgeon: Vernie Murders, MD;  Location: [facility];  Service: ENT;  Laterality: Left;   RHINOPLASTY  02/24/2018   septoplasty    SEPTOPLASTY Bilateral 02/24/2018   Procedure: SEPTOPLASTY;  Surgeon: Vernie Murders, MD;  Location: [facility];  Service: ENT;  Laterality: Bilateral;   SEPTOPLASTY Bilateral    TONSILLECTOMY     TONSILLECTOMY AND ADENOIDECTOMY N/A 05/27/2017    Procedure: TONSILLECTOMY AND ADENOIDECTOMY;  Surgeon: Vernie Murders, MD;  Location: [facility];  Service: ENT;  Laterality: N/A;   WISDOM TOOTH EXTRACTION  10/22/2017   four   Family History:  Family History  Problem Relation Age of Onset   Breast cancer Maternal Grandmother 53   Hypertension Maternal Grandmother    Heart disease Maternal Grandmother    Stroke Maternal Grandmother 67   Depression Mother    Lung cancer Maternal Uncle    Drug abuse Father    Family Psychiatric  History: Family hx of substance abuse (father) Social History:  Social History   Substance and Sexual Activity  Alcohol Use Yes   Comment: social     Social History   Substance and Sexual Activity  Drug Use Yes   Types: Marijuana    Social History   Socioeconomic History   Marital status: Single    Spouse name: Not on file   Number of children: 0   Years of education: 12   Highest education level: Not on file  Occupational History   Occupation: student  Tobacco Use   Smoking status: Former    Types: Cigarettes   Smokeless tobacco: Former  Building services engineer status: Never Used  Substance and Sexual Activity   Alcohol use: Yes    Comment: social   Drug use: Yes    Types: Marijuana   Sexual activity: Yes    Partners: Male    Birth control/protection: Other-see comments    Comment: Nuvaring   Other Topics Concern   Not on file  Social History  Narrative   Not on file   Social Determinants of Health   Financial Resource Strain: Not on file  Food Insecurity: No Food Insecurity (09/14/2022)   Hunger Vital Sign    Worried About Running Out of Food in the Last Year: Never true    Ran Out of Food in the Last Year: Never true  Transportation Needs: No Transportation Needs (09/14/2022)   PRAPARE - Administrator, Civil Service (Medical): No    Lack of Transportation (Non-Medical): No  Physical Activity: Inactive (07/19/2017)   Exercise Vital Sign    Days of Exercise per  Week: 0 days    Minutes of Exercise per Session: 0 min  Stress: No Stress Concern Present (07/19/2017)   [employer] of Occupational Health - Occupational Stress Questionnaire    Feeling of Stress : Not at all  Social Connections: Not on file    Hospital Course:  Jessica Hebert was admitted for MDD (major depressive disorder), recurrent episode, severe (HCC) and crisis management. Her home medications were restarted as appropriate. Jessica Hebert was discharged with current medication and was instructed on how to take medications as prescribed; (details listed below under Medication List).  Medical problems were identified and treated as needed.   Improvement was monitored by observation and Jessica Hebert daily report of symptom reduction.  Emotional and mental status was monitored by daily self-inventory reports completed by Jessica Hebert and clinical staff.         Jessica Hebert was evaluated by the treatment team for stability and plans for continued recovery upon discharge.  Jessica Hebert motivation was an integral factor for scheduling further treatment.  Employment, transportation, bed availability, health status, family support, and any pending legal issues were also considered during her hospital stay.  She was offered further treatment options upon discharge including but not limited to Residential, Intensive Outpatient, and Outpatient treatment.  Jessica Hebert will follow up with the services as listed below under Follow Up Information.     Upon completion of this admission the Jessica Hebert was both mentally and medically stable for discharge denying suicidal/homicidal ideation, auditory/visual/tactile hallucinations, delusional thoughts and paranoia.       Physical Findings: AIMS:  , ,  ,  ,    CIWA:    COWS:     Musculoskeletal: Strength & Muscle Tone: within normal limits Gait & Station: normal Patient leans: N/A   Psychiatric Specialty Exam:  Presentation   General Appearance:  Appropriate for Environment  Eye Contact: Good  Speech: Clear and Coherent; Normal Rate  Speech Volume: Normal  Handedness: Right   Mood and Affect  Mood: Euthymic  Affect: Appropriate; Full Range   Thought Process  Thought Processes: Coherent; Goal Directed; Linear  Descriptions of Associations:Intact  Orientation:Full (Time, Place and Person)  Thought Content:Logical  History of Schizophrenia/Schizoaffective disorder:No  Duration of Psychotic Symptoms:No data recorded Hallucinations:Hallucinations: None  Ideas of Reference:None  Suicidal Thoughts:Suicidal Thoughts: No  Homicidal Thoughts:Homicidal Thoughts: No   Sensorium  Memory: Immediate Good; Recent Good; Remote Good  Judgment: Intact  Insight: Fair   Art therapist  Concentration: Fair  Attention Span: Fair  Recall: Good  Fund of Knowledge: Good  Language: Good   Psychomotor Activity  Psychomotor Activity: Psychomotor Activity: Normal   Assets  Assets: Communication Skills; Desire for Improvement; Financial Resources/Insurance; Housing; Leisure Time; Resilience; Social Support; Vocational/Educational   Sleep  Sleep: Sleep: Fair    Physical Exam: Physical  Exam Constitutional:      General: She is not in acute distress.    Appearance: She is not ill-appearing, toxic-appearing or diaphoretic.  Eyes:     General: No scleral icterus. Cardiovascular:     Rate and Rhythm: Normal rate.  Pulmonary:     Effort: Pulmonary effort is normal. No respiratory distress.  Neurological:     Mental Status: She is alert and oriented to person, place, and time.  Psychiatric:        Attention and Perception: Attention and perception normal.        Mood and Affect: Mood and affect normal.        Speech: Speech normal.        Behavior: Behavior normal. Behavior is cooperative.        Thought Content: Thought content normal.        Cognition and  Memory: Cognition and memory normal.    Review of Systems  Constitutional:  Negative for chills and fever.  Respiratory:  Negative for shortness of breath.   Cardiovascular:  Negative for chest pain and palpitations.  Gastrointestinal:  Negative for abdominal pain.  Neurological:  Negative for headaches.  Psychiatric/Behavioral: Negative.     Blood pressure 115/64, pulse 78, temperature 98.5 F (36.9 C), temperature source Oral, resp. rate 19, height 5\' 9"  (1.753 m), weight 114.8 kg, last menstrual period 08/13/2022, SpO2 98%. Body mass index is 37.36 kg/m.   Social History   Tobacco Use  Smoking Status Former   Types: Cigarettes  Smokeless Tobacco Former   Tobacco Cessation:  A prescription for an FDA-approved tobacco cessation medication was offered at discharge and the patient refused   Blood Alcohol level:  Lab Results  Component Value Date   ETH 120 (H) 09/12/2022   ETH <10 03/23/2022    Metabolic Disorder Labs:  Lab Results  Component Value Date   HGBA1C 5.1 03/26/2022   MPG 99.67 03/26/2022   No results found for: "PROLACTIN" Lab Results  Component Value Date   CHOL 236 (H) 03/26/2022   TRIG 141 03/26/2022   HDL 48 03/26/2022   CHOLHDL 4.9 03/26/2022   VLDL 28 03/26/2022   LDLCALC 160 (H) 03/26/2022   LDLCALC 115 (H) 11/08/2018    See Psychiatric Specialty Exam and Suicide Risk Assessment completed by Attending Physician prior to discharge.  Discharge destination:  Home  Is patient on multiple antipsychotic therapies at discharge:  No   Has Patient had three or more failed trials of antipsychotic monotherapy by history:  No  Recommended Plan for Multiple Antipsychotic Therapies: NA   Allergies as of 09/17/2022       Reactions   Grapeseed Extract [nutritional Supplements] Shortness Of Breath   Grapes cause wheezing   Egg-derived Products Itching, Nausea And Vomiting   Other    Cats- swelling eyes; sneezing   Pollen Extract Other (See  Comments)   Hickory tree - sneezing        Medication List     TAKE these medications      Indication  amphetamine-dextroamphetamine 15 MG 24 hr capsule Commonly known as: ADDERALL XR Take by mouth.    budesonide-formoterol 160-4.5 MCG/ACT inhaler Commonly known as: SYMBICORT Inhale 2 puffs into the lungs 2 (two) times daily.  Indication: Asthma   cetirizine 10 MG tablet Commonly known as: ZyrTEC Allergy Take 1 tablet (10 mg total) by mouth daily.    escitalopram 20 MG tablet Commonly known as: LEXAPRO Take 20 mg by mouth daily.  Indication: Major Depressive Disorder   etonogestrel-ethinyl estradiol 0.12-0.015 MG/24HR vaginal ring Commonly known as: NuvaRing INSERT 1 RING IN THE VAGINA AND LEAVE IN PLACE FOR 3 CONSECUTIVE WEEKS, THEN REMOVE FOR 1 WEEK AS DIRECTED.  Indication: Birth Control Treatment   hydrOXYzine 25 MG tablet Commonly known as: ATARAX Take 25 mg by mouth daily as needed for anxiety.  Indication: Feeling Anxious        Follow-up Information     [facility]. Go to.   Specialty: Behavioral Health Why: They have walk-in hours for both medication management and therapy. However, you will need to be seen by therapist first to complete initial intake assessment. As such the walk-in hours are Mondays, Wednesdays, and Thursdays beginning at 8AM. It is recommended that you arrive prior to 7:30AM, let them know you are seeking outpatient services, sign in, and they will begin seeing clients at 8AM. Thanks! Contact information: [address] [place] [address] [phone]               Follow-up recommendations:  Other:  Pt is to follow up with outpatient counseling and medication management   Signed: Lauree Chandler, NP 09/17/2022, 2:00 PM

## 2022-09-17 NOTE — Plan of Care (Signed)
Pt. Assessed in the milieu, being, bright, pleasant, interacting with peers, eating well, 100% of breakfast. Pt was compliant with taking her medication, denied having thoughts to harm herself or others and gave a verbal commitment to stay safe.  Pt was noted with 2 bruises to left forearm, she stated they were related  to her IVF when in the ED  but was not noted by this author on her admission. Pt focused on been discharge, she feels her medication is "working". And she is compliant with her medication regimen.   Problem: Education: Goal: Knowledge of General Education information will improve Description: Including pain rating scale, medication(s)/side effects and non-pharmacologic comfort measures Outcome: Completed/Met   Problem: Health Behavior/Discharge Planning: Goal: Ability to manage health-related needs will improve Outcome: Completed/Met   Problem: Clinical Measurements: Goal: Ability to maintain clinical measurements within normal limits will improve Outcome: Completed/Met Goal: Will remain free from infection Outcome: Progressing Goal: Diagnostic test results will improve Outcome: Progressing Goal: Cardiovascular complication will be avoided Outcome: Progressing   Problem: Activity: Goal: Risk for activity intolerance will decrease Outcome: Progressing   Problem: Nutrition: Goal: Adequate nutrition will be maintained Outcome: Progressing   Problem: Coping: Goal: Level of anxiety will decrease Outcome: Progressing   Problem: Elimination: Goal: Will not experience complications related to bowel motility Outcome: Progressing Goal: Will not experience complications related to urinary retention Outcome: Progressing

## 2022-09-18 NOTE — BHH Counselor (Signed)
CSW notified by staff nurse that pt had called to inquire regarding a work note that she had not received. Number given to contact at [phone], which is pt's number in chart.   CSW phone pt at [phone]. Pt confirmed her birthday. Pt shared that she just needed her work note and stated that it could be sent via MyChart. CSW informed her that he was working on it and it will be sent to her as soon as he finishes. She agreed. No other concerns expressed. Contact ended without incident.   CSW sent pt work note via Clinical cytogeneticist.   Vilma Meckel. Algis Greenhouse, MSW, LCSW, LCAS 09/18/2022 4:06 PM

## 2022-10-01 ENCOUNTER — Emergency Department
Admission: EM | Admit: 2022-10-01 | Discharge: 2022-10-01 | Disposition: A | Discharge status: Home / Self Care | Payer: Medicaid Other | Attending: Emergency Medicine | Admitting: Emergency Medicine

## 2022-10-01 ENCOUNTER — Other Ambulatory Visit: Payer: Self-pay

## 2022-10-01 DIAGNOSIS — S20462A Insect bite (nonvenomous) of left back wall of thorax, initial encounter: Secondary | ICD-10-CM | POA: Insufficient documentation

## 2022-10-01 DIAGNOSIS — W57XXXA Bitten or stung by nonvenomous insect and other nonvenomous arthropods, initial encounter: Secondary | ICD-10-CM | POA: Insufficient documentation

## 2022-10-01 DIAGNOSIS — T7840XA Allergy, unspecified, initial encounter: Secondary | ICD-10-CM | POA: Diagnosis not present

## 2022-10-01 MED ORDER — DIPHENHYDRAMINE HCL 25 MG PO CAPS
25.0000 mg | ORAL_CAPSULE | Freq: Once | ORAL | Status: AC
  Administered 2022-10-01: 25 mg via ORAL
  Filled 2022-10-01: qty 1

## 2022-10-01 MED ORDER — DEXAMETHASONE SODIUM PHOSPHATE 10 MG/ML IJ SOLN
10.0000 mg | Freq: Once | INTRAMUSCULAR | Status: AC
  Administered 2022-10-01: 10 mg via INTRAMUSCULAR
  Filled 2022-10-01: qty 1

## 2022-10-01 MED ORDER — ONDANSETRON 4 MG PO TBDP
4.0000 mg | ORAL_TABLET | Freq: Once | ORAL | Status: AC
  Administered 2022-10-01: 4 mg via ORAL
  Filled 2022-10-01: qty 1

## 2022-10-01 MED ORDER — PREDNISONE 10 MG PO TABS: 10.0000 mg | ORAL_TABLET | Freq: Every day | ORAL | 0 refills | Status: DC

## 2022-10-01 NOTE — ED Provider Notes (Signed)
[place] EMERGENCY DEPARTMENT AT [facility] Provider Note   CSN: [id number] Arrival date & time: 10/01/22  2055     History  Chief Complaint  Patient presents with   Insect Bite    Jessica Hebert is a 22 y.o. female presents to the emergency department evaluation of insect bite.  Earlier today she was stung or bitten by something on her back and developed a red welt that is painful, burning and itching.  She used some calamine spray with no relief.  She states that the pain was severe and caused her to have some nausea/vomiting.  Currently only nauseous.  No chest pain shortness of breath or facial swelling.  HPI     Home Medications Prior to Admission medications   Medication Sig Start Date End Date Taking? Authorizing Provider  predniSONE (DELTASONE) 10 MG tablet Take 1 tablet (10 mg total) by mouth daily. 6,5,4,3,2,1 six day taper 10/01/22  Yes Evon Slack, PA-C  amphetamine-dextroamphetamine (ADDERALL XR) 15 MG 24 hr capsule Take by mouth. 06/23/22 09/13/22  [provider]  budesonide-formoterol (SYMBICORT) 160-4.5 MCG/ACT inhaler Inhale 2 puffs into the lungs 2 (two) times daily. 11/13/20   [provider]  cetirizine (ZYRTEC ALLERGY) 10 MG tablet Take 1 tablet (10 mg total) by mouth daily. Patient not taking: Reported on 09/13/2022 08/26/20 01/22/22  Domenick Gong, MD  escitalopram (LEXAPRO) 20 MG tablet Take 20 mg by mouth daily. 09/29/21   [provider]  etonogestrel-ethinyl estradiol (NUVARING) 0.12-0.015 MG/24HR vaginal ring INSERT 1 RING IN THE VAGINA AND LEAVE IN PLACE FOR 3 CONSECUTIVE WEEKS, THEN REMOVE FOR 1 WEEK AS DIRECTED. 11/08/18   Farrel Conners, CNM  hydrOXYzine (ATARAX) 25 MG tablet Take 25 mg by mouth daily as needed for anxiety. 04/08/22   [provider]  triamcinolone (NASACORT) 55 MCG/ACT AERO nasal inhaler Place into the nose.  10/03/18  [provider]      Allergies    Grapeseed extract  [nutritional supplements], Egg-derived products, Other, and Pollen extract    Review of Systems   Review of Systems  Physical Exam Updated Vital Signs BP 123/82   Pulse 87   Temp 97.7 F (36.5 C) (Oral)   Resp 18   Ht 5\' 9"  (1.753 m)   Wt 81.6 kg   LMP 08/13/2022 (Approximate)   SpO2 100%   BMI 26.58 kg/m  Physical Exam Constitutional:      Appearance: She is well-developed.  HENT:     Head: Normocephalic and atraumatic.     Mouth/Throat:     Mouth: Mucous membranes are moist.     Pharynx: Oropharynx is clear. No oropharyngeal exudate or posterior oropharyngeal erythema.     Comments: No angioedema Eyes:     Conjunctiva/sclera: Conjunctivae normal.  Cardiovascular:     Rate and Rhythm: Normal rate.  Pulmonary:     Effort: Pulmonary effort is normal. No respiratory distress.     Breath sounds: Normal breath sounds. No stridor. No wheezing, rhonchi or rales.  Chest:     Chest wall: No tenderness.  Musculoskeletal:        General: Normal range of motion.     Cervical back: Normal range of motion.  Skin:    General: Skin is warm.     Findings: Rash present.     Comments: 5 cm erythematous macular rash along the left lower thoracic region, this is erythematous and slightly raised but no streaking.  There is no fluctuance or induration.  No central bite site or discoloration  Neurological:     Mental Status: She is alert and oriented to person, place, and time.  Psychiatric:        Behavior: Behavior normal.        Thought Content: Thought content normal.     ED Results / Procedures / Treatments   Labs (all labs ordered are listed, but only abnormal results are displayed) Labs Reviewed - No data to display  EKG None  Radiology No results found.  Procedures Procedures    Medications Ordered in ED Medications  dexamethasone (DECADRON) injection 10 mg (10 mg Intramuscular Given 10/01/22 2210)  diphenhydrAMINE (BENADRYL) capsule 25 mg (25 mg Oral Given  10/01/22 2208)  ondansetron (ZOFRAN-ODT) disintegrating tablet 4 mg (4 mg Oral Given 10/01/22 2209)    ED Course/ Medical Decision Making/ A&P                             Medical Decision Making Risk Prescription drug management.   22 year old female with bite or sting to the left lower thoracic region.  She suffered a macular erythematous rash x 1 with pain and burning.  She also developed nausea and vomiting.  She was given dexamethasone 10 mg IM along with 25 mg Benadryl and Zofran ODT and symptoms resolved.  Rash significantly improved and sent asymptomatic.  She is no longer feeling any pain swelling burning and her nausea vomiting has resolved.  Lungs are clear to auscultation.  No facial swelling or angioedema.  No other rash throughout the body.  Vital signs are stable, afebrile nontachycardic with normal O2 sats.  Patient is given 6-day steroid taper with that she can start tomorrow if any rash or signs of allergic reaction persist.  She is educated on symptoms to take the medication for as well as return to the ER precautions. Final Clinical Impression(s) / ED Diagnoses Final diagnoses:  Allergic reaction, initial encounter  Insect bite of left back wall of thorax, initial encounter    Rx / DC Orders ED Discharge Orders          Ordered    predniSONE (DELTASONE) 10 MG tablet  Daily        10/01/22 2255              Evon Slack, PA-C 10/01/22 2302    Merwyn Katos, MD 10/09/22 [phone]

## 2022-10-01 NOTE — Discharge Instructions (Signed)
He can continue with topical calamine as needed.  If continuing to have any pain discomfort itching or burning tomorrow you may start prednisone taper.  Return to the ER for any worsening rash, shortness of breath, Diffley swallowing, vomiting, worsening symptoms or any urgent changes in your health

## 2022-10-01 NOTE — ED Notes (Signed)
See triage note. Pt states pain has increased since she arrived. Pt with emesis bag and vomited as pain increased. Pt denies any facial swelling. Denies any Hx of allergy to stings.

## 2022-10-01 NOTE — ED Triage Notes (Signed)
Pt reports unknown insect bite or sting to upper back just prior to arrival. Denies known environmental/insect allergies. Ambulatory to triage. Alert and oriented following commands. Breathing unlabored speaking in full sentences with reported normal voice and maintaining secretions.

## 2022-10-27 ENCOUNTER — Ambulatory Visit
Admission: EM | Admit: 2022-10-27 | Discharge: 2022-10-27 | Disposition: A | Discharge status: Home / Self Care | Payer: Medicaid Other | Attending: Emergency Medicine | Admitting: Emergency Medicine

## 2022-10-27 ENCOUNTER — Encounter: Payer: Self-pay | Admitting: Emergency Medicine

## 2022-10-27 DIAGNOSIS — R040 Epistaxis: Secondary | ICD-10-CM | POA: Diagnosis not present

## 2022-10-27 DIAGNOSIS — R0981 Nasal congestion: Secondary | ICD-10-CM

## 2022-10-27 DIAGNOSIS — R519 Headache, unspecified: Secondary | ICD-10-CM

## 2022-10-27 MED ORDER — GUAIFENESIN ER 600 MG PO TB12: 1200.0000 mg | ORAL_TABLET | Freq: Two times a day (BID) | ORAL | 0 refills | Status: DC | PRN

## 2022-10-27 NOTE — ED Triage Notes (Signed)
Pt presents with a headache x 4 days with photo-sensitivity. PT stated the pain is worse at night and this morning she had scabs in her nose and it was bleeding.  Pt took OTC pain medication yesterday.

## 2022-10-27 NOTE — ED Provider Notes (Signed)
Jessica Hebert    CSN: [id number] Arrival date & time: 10/27/22  1533      History   Chief Complaint Chief Complaint  Patient presents with   Headache    HPI Jessica Hebert is a 22 y.o. female.  Patient presents with 4 day history of sinus headache, sinus pressure, congestion.  She reports occasional nose bleeds since the onset of her symptoms.  No OTC medications taken today.  No fever, chills, sore throat, cough, or other symptoms.  Her medical history includes migraine headache, asthma, depression, anxiety, suicide attempt, bulimia, panic disorder.  The history is provided by the patient and medical records.    Past Medical History:  Diagnosis Date   Acne    Anxiety    Asthma    Depression    Eating disorder, unspecified    Bulimia   Eczema    GERD (gastroesophageal reflux disease)    Headache    sinus related    Patient Active Problem List   Diagnosis Date Noted   MDD (major depressive disorder), recurrent episode, severe (HCC) 09/14/2022   Bulimia nervosa 03/28/2022   MDD (major depressive disorder), recurrent severe, without psychosis (HCC) 03/25/2022   Generalized anxiety disorder 03/25/2022   Panic disorder 03/25/2022   MDD (major depressive disorder) 03/24/2022   Intentional overdose (HCC) 03/23/2022   Attention deficit hyperactivity disorder (ADHD) 07/24/2019   Allergic rhinitis due to allergen 07/24/2019   Overweight 07/24/2019   Intractable migraine without aura and without status migrainosus 05/09/2018   Acne 07/19/2016   Asthma 07/19/2016   Anxiety and depression 07/19/2016    Past Surgical History:  Procedure Laterality Date   ENDOSCOPIC CONCHA BULLOSA RESECTION Left 02/24/2018   Procedure: ENDOSCOPIC CONCHA BULLOSA REDUCTION VIA NASAL ENDOSCOPY;  Surgeon: Vernie Murders, MD;  Location: [facility];  Service: ENT;  Laterality: Left;   RHINOPLASTY  02/24/2018   septoplasty    SEPTOPLASTY Bilateral 02/24/2018   Procedure:  SEPTOPLASTY;  Surgeon: Vernie Murders, MD;  Location: [facility];  Service: ENT;  Laterality: Bilateral;   SEPTOPLASTY Bilateral    TONSILLECTOMY     TONSILLECTOMY AND ADENOIDECTOMY N/A 05/27/2017   Procedure: TONSILLECTOMY AND ADENOIDECTOMY;  Surgeon: Vernie Murders, MD;  Location: [facility];  Service: ENT;  Laterality: N/A;   WISDOM TOOTH EXTRACTION  10/22/2017   four    OB History     Gravida  0   Para  0   Term  0   Preterm  0   AB  0   Living  0      SAB  0   IAB  0   Ectopic  0   Multiple  0   Live Births  0            Home Medications    Prior to Admission medications   Medication Sig Start Date End Date Taking? Authorizing Provider  amphetamine-dextroamphetamine (ADDERALL XR) 15 MG 24 hr capsule Take by mouth. 06/23/22 10/27/22 Yes [provider]  budesonide-formoterol (SYMBICORT) 160-4.5 MCG/ACT inhaler Inhale 2 puffs into the lungs 2 (two) times daily. 11/13/20  Yes [provider]  escitalopram (LEXAPRO) 20 MG tablet Take 20 mg by mouth daily. 09/29/21  Yes [provider]  guaiFENesin (MUCINEX) 600 MG 12 hr tablet Take 2 tablets (1,200 mg total) by mouth 2 (two) times daily as needed. 10/27/22  Yes Mickie Bail, NP  hydrOXYzine (ATARAX) 25 MG tablet Take 25 mg by mouth daily as  needed for anxiety. 04/08/22  Yes [provider]  cetirizine (ZYRTEC ALLERGY) 10 MG tablet Take 1 tablet (10 mg total) by mouth daily. Patient not taking: Reported on 09/13/2022 08/26/20 01/22/22  Domenick Gong, MD  etonogestrel-ethinyl estradiol (NUVARING) 0.12-0.015 MG/24HR vaginal ring INSERT 1 RING IN THE VAGINA AND LEAVE IN PLACE FOR 3 CONSECUTIVE WEEKS, THEN REMOVE FOR 1 WEEK AS DIRECTED. 11/08/18   Farrel Conners, CNM  predniSONE (DELTASONE) 10 MG tablet Take 1 tablet (10 mg total) by mouth daily. 6,5,4,3,2,1 six day taper 10/01/22   Evon Slack, PA-C  triamcinolone (NASACORT) 55 MCG/ACT AERO nasal inhaler Place  into the nose.  10/03/18  [provider]    Family History Family History  Problem Relation Age of Onset   Breast cancer Maternal Grandmother 53   Hypertension Maternal Grandmother    Heart disease Maternal Grandmother    Stroke Maternal Grandmother 82   Depression Mother    Lung cancer Maternal Uncle    Drug abuse Father     Social History Social History   Tobacco Use   Smoking status: Former    Types: Cigarettes   Smokeless tobacco: Former  Building services engineer status: Never Used  Substance Use Topics   Alcohol use: Yes    Comment: social   Drug use: Yes    Types: Marijuana     Allergies   Grapeseed extract [nutritional supplements], Egg-derived products, Other, and Pollen extract   Review of Systems Review of Systems  Constitutional:  Negative for chills and fever.  HENT:  Positive for congestion, nosebleeds, rhinorrhea and sinus pressure. Negative for ear pain and sore throat.   Respiratory:  Negative for cough and shortness of breath.   Skin:  Negative for color change and rash.  Neurological:  Positive for headaches.     Physical Exam Triage Vital Signs ED Triage Vitals [10/27/22 1540]  Encounter Vitals Group     BP      Systolic BP Percentile      Diastolic BP Percentile      Pulse Rate (!) 108     Resp 18     Temp 98.3 F (36.8 C)     Temp src      SpO2 99 %     Weight      Height      Head Circumference      Peak Flow      Pain Score      Pain Loc      Pain Education      Exclude from Growth Chart    No data found.  Updated Vital Signs BP 122/76 (BP Location: Left Arm)   Pulse 100   Temp 98.3 F (36.8 C)   Resp 18   LMP 10/13/2022 (Approximate)   SpO2 99%   Visual Acuity Right Eye Distance:   Left Eye Distance:   Bilateral Distance:    Right Eye Near:   Left Eye Near:    Bilateral Near:     Physical Exam Vitals and nursing note reviewed.  Constitutional:      General: She is not in acute distress.     Appearance: She is well-developed.  HENT:     Right Ear: Tympanic membrane normal.     Left Ear: Tympanic membrane normal.     Nose: Congestion present.     Comments: Dried blood in right nostril.     Mouth/Throat:     Mouth: Mucous membranes are moist.  Pharynx: Oropharynx is clear.  Cardiovascular:     Rate and Rhythm: Normal rate and regular rhythm.     Heart sounds: Normal heart sounds.  Pulmonary:     Effort: Pulmonary effort is normal. No respiratory distress.     Breath sounds: Normal breath sounds.  Musculoskeletal:     Cervical back: Neck supple.  Skin:    General: Skin is warm and dry.  Neurological:     Mental Status: She is alert.  Psychiatric:        Mood and Affect: Mood normal.        Behavior: Behavior normal.      UC Treatments / Results  Labs (all labs ordered are listed, but only abnormal results are displayed) Labs Reviewed - No data to display  EKG   Radiology No results found.  Procedures Procedures (including critical care time)  Medications Ordered in UC Medications - No data to display  Initial Impression / Assessment and Plan / UC Course  I have reviewed the triage vital signs and the nursing notes.  Pertinent labs & imaging results that were available during my care of the patient were reviewed by me and considered in my medical decision making (see chart for details).    Nasal congestion, sinus headache, epistaxis.  Afebrile and vital signs are stable.  Treating with Mucinex.  Also suggested OTC nasal saline spray.  Education provided on sinus pain and nosebleeds.  Instructed patient to follow-up with her primary care provider if her symptoms are not improving.  Work note provided per patient request.  She agrees to plan of care.  Final Clinical Impressions(s) / UC Diagnoses   Final diagnoses:  Nasal congestion  Epistaxis  Sinus headache     Discharge Instructions      Take the Mucinex as directed.  Use a nasal saline  spray as directed.  Follow up with your primary care provider if your symptoms are not improving.        ED Prescriptions     Medication Sig Dispense Auth. Provider   guaiFENesin (MUCINEX) 600 MG 12 hr tablet Take 2 tablets (1,200 mg total) by mouth 2 (two) times daily as needed. 20 tablet Mickie Bail, NP      PDMP not reviewed this encounter.   Mickie Bail, NP 10/27/22 [phone]

## 2022-10-27 NOTE — Discharge Instructions (Addendum)
Take the Mucinex as directed.  Use a nasal saline spray as directed.  Follow up with your primary care provider if your symptoms are not improving.

## 2022-11-25 ENCOUNTER — Ambulatory Visit: Payer: Medicaid Other

## 2022-11-27 ENCOUNTER — Ambulatory Visit: Payer: Medicaid Other

## 2022-11-27 ENCOUNTER — Ambulatory Visit (LOCAL_COMMUNITY_HEALTH_CENTER): Payer: Medicaid Other

## 2022-11-27 DIAGNOSIS — Z111 Encounter for screening for respiratory tuberculosis: Secondary | ICD-10-CM

## 2022-11-27 DIAGNOSIS — Z7185 Encounter for immunization safety counseling: Secondary | ICD-10-CM

## 2022-11-27 NOTE — Progress Notes (Signed)
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 Per [facility], last Tdap greater than 79yrs ago. Per Epic, Tdap 03/26/22. This was explained to patient. Updated [facility] copy given. Jerel Shepherd, RN

## 2022-11-30 ENCOUNTER — Ambulatory Visit (LOCAL_COMMUNITY_HEALTH_CENTER): Payer: Medicaid Other

## 2022-11-30 DIAGNOSIS — Z111 Encounter for screening for respiratory tuberculosis: Secondary | ICD-10-CM

## 2022-11-30 LAB — TB SKIN TEST
Induration: 0 mm
TB Skin Test: NEGATIVE

## 2023-01-15 ENCOUNTER — Ambulatory Visit
Admission: EM | Admit: 2023-01-15 | Discharge: 2023-01-15 | Disposition: A | Discharge status: Home / Self Care | Payer: Medicaid Other | Attending: Physician Assistant | Admitting: Physician Assistant

## 2023-01-15 DIAGNOSIS — N3000 Acute cystitis without hematuria: Secondary | ICD-10-CM | POA: Insufficient documentation

## 2023-01-15 LAB — URINALYSIS, W/ REFLEX TO CULTURE (INFECTION SUSPECTED)
Bilirubin Urine: NEGATIVE
Glucose, UA: NEGATIVE mg/dL
Ketones, ur: NEGATIVE mg/dL
Nitrite: NEGATIVE
Protein, ur: 100 mg/dL — AB
Specific Gravity, Urine: 1.025 (ref 1.005–1.030)
WBC, UA: >50 WBC/hpf (ref 0–5)
pH: 8.5 — ABNORMAL HIGH (ref 5.0–8.0)

## 2023-01-15 MED ORDER — NITROFURANTOIN MONOHYD MACRO 100 MG PO CAPS: 100.0000 mg | ORAL_CAPSULE | Freq: Two times a day (BID) | ORAL | 0 refills | Status: DC

## 2023-01-15 NOTE — ED Provider Notes (Signed)
[facility]    CSN: [id number] Arrival date & time: 01/15/23  1110      History   Chief Complaint Chief Complaint  Patient presents with   Urinary Frequency   Back Pain    HPI Jessica Hebert is a 22 y.o. female presenting for approximately 1 week history of urinary frequency, incomplete bladder emptying, discomfort with urination and low back pain.  Chills reports cloudy urine.  Denies vaginal odor/itching or discharge.  Patient believes she has a urinary infection.  No other complaints.  HPI  Past Medical History:  Diagnosis Date   Acne    Anxiety    Asthma    Depression    Eating disorder, unspecified    Bulimia   Eczema    GERD (gastroesophageal reflux disease)    Headache    sinus related    Patient Active Problem List   Diagnosis Date Noted   MDD (major depressive disorder), recurrent episode, severe (HCC) 09/14/2022   Bulimia nervosa 03/28/2022   MDD (major depressive disorder), recurrent severe, without psychosis (HCC) 03/25/2022   Generalized anxiety disorder 03/25/2022   Panic disorder 03/25/2022   MDD (major depressive disorder) 03/24/2022   Intentional overdose (HCC) 03/23/2022   Attention deficit hyperactivity disorder (ADHD) 07/24/2019   Allergic rhinitis due to allergen 07/24/2019   Overweight 07/24/2019   Intractable migraine without aura and without status migrainosus 05/09/2018   Acne 07/19/2016   Asthma 07/19/2016   Anxiety and depression 07/19/2016    Past Surgical History:  Procedure Laterality Date   ENDOSCOPIC CONCHA BULLOSA RESECTION Left 02/24/2018   Procedure: ENDOSCOPIC CONCHA BULLOSA REDUCTION VIA NASAL ENDOSCOPY;  Surgeon: Vernie Murders, MD;  Location: [facility];  Service: ENT;  Laterality: Left;   RHINOPLASTY  02/24/2018   septoplasty    SEPTOPLASTY Bilateral 02/24/2018   Procedure: SEPTOPLASTY;  Surgeon: Vernie Murders, MD;  Location: [facility];  Service: ENT;  Laterality: Bilateral;    SEPTOPLASTY Bilateral    TONSILLECTOMY     TONSILLECTOMY AND ADENOIDECTOMY N/A 05/27/2017   Procedure: TONSILLECTOMY AND ADENOIDECTOMY;  Surgeon: Vernie Murders, MD;  Location: [facility];  Service: ENT;  Laterality: N/A;   WISDOM TOOTH EXTRACTION  10/22/2017   four    OB History     Gravida  0   Para  0   Term  0   Preterm  0   AB  0   Living  0      SAB  0   IAB  0   Ectopic  0   Multiple  0   Live Births  0            Home Medications    Prior to Admission medications   Medication Sig Start Date End Date Taking? Authorizing Provider  escitalopram (LEXAPRO) 20 MG tablet Take 20 mg by mouth daily. 09/29/21  Yes [provider]  etonogestrel-ethinyl estradiol (NUVARING) 0.12-0.015 MG/24HR vaginal ring INSERT 1 RING IN THE VAGINA AND LEAVE IN PLACE FOR 3 CONSECUTIVE WEEKS, THEN REMOVE FOR 1 WEEK AS DIRECTED. 11/08/18  Yes Farrel Conners, CNM  nitrofurantoin, macrocrystal-monohydrate, (MACROBID) 100 MG capsule Take 1 capsule (100 mg total) by mouth 2 (two) times daily. 01/15/23  Yes Shirlee Latch, PA-C  amphetamine-dextroamphetamine (ADDERALL XR) 15 MG 24 hr capsule Take by mouth. 06/23/22 10/27/22  [provider]  budesonide-formoterol (SYMBICORT) 160-4.5 MCG/ACT inhaler Inhale 2 puffs into the lungs 2 (two) times daily. 11/13/20   [provider]  cetirizine (ZYRTEC ALLERGY) 10 MG tablet Take 1 tablet (10 mg total) by mouth daily. Patient not taking: Reported on 09/13/2022 08/26/20 01/22/22  Domenick Gong, MD  guaiFENesin (MUCINEX) 600 MG 12 hr tablet Take 2 tablets (1,200 mg total) by mouth 2 (two) times daily as needed. Patient not taking: Reported on 11/27/2022 10/27/22   Mickie Bail, NP  hydrOXYzine (ATARAX) 25 MG tablet Take 25 mg by mouth daily as needed for anxiety. 04/08/22   [provider]  predniSONE (DELTASONE) 10 MG tablet Take 1 tablet (10 mg total) by mouth daily. 6,5,4,3,2,1 six day taper Patient not  taking: Reported on 11/27/2022 10/01/22   Evon Slack, PA-C  triamcinolone (NASACORT) 55 MCG/ACT AERO nasal inhaler Place into the nose.  10/03/18  [provider]    Family History Family History  Problem Relation Age of Onset   Breast cancer Maternal Grandmother 53   Hypertension Maternal Grandmother    Heart disease Maternal Grandmother    Stroke Maternal Grandmother 52   Depression Mother    Lung cancer Maternal Uncle    Drug abuse Father     Social History Social History   Tobacco Use   Smoking status: Former    Types: Cigarettes   Smokeless tobacco: Former  Building services engineer status: Never Used  Substance Use Topics   Alcohol use: Yes    Comment: social   Drug use: Yes    Types: Marijuana     Allergies   Grapeseed extract [nutritional supplements], Egg-derived products, Other, and Pollen extract   Review of Systems Review of Systems  Constitutional:  Negative for chills, fatigue and fever.  Gastrointestinal:  Negative for abdominal pain, diarrhea, nausea and vomiting.  Genitourinary:  Positive for dysuria, frequency and urgency. Negative for decreased urine volume, flank pain, hematuria, pelvic pain, vaginal bleeding, vaginal discharge and vaginal pain.  Musculoskeletal:  Positive for back pain.  Skin:  Negative for rash.     Physical Exam Triage Vital Signs ED Triage Vitals  Encounter Vitals Group     BP      Systolic BP Percentile      Diastolic BP Percentile      Pulse      Resp      Temp      Temp src      SpO2      Weight      Height      Head Circumference      Peak Flow      Pain Score      Pain Loc      Pain Education      Exclude from Growth Chart    No data found.  Updated Vital Signs BP 128/87 (BP Location: Right Arm)   Pulse 91   Temp 98.3 F (36.8 C) (Oral)   Resp 17   LMP 01/06/2023 (Exact Date)   SpO2 97%    Physical Exam Vitals and nursing note reviewed.  Constitutional:      General: She is not in  acute distress.    Appearance: Normal appearance. She is not ill-appearing or toxic-appearing.  HENT:     Head: Normocephalic and atraumatic.  Eyes:     General: No scleral icterus.       Right eye: No discharge.        Left eye: No discharge.     Conjunctiva/sclera: Conjunctivae normal.  Cardiovascular:     Rate and Rhythm: Normal rate and regular rhythm.  Heart sounds: Normal heart sounds.  Pulmonary:     Effort: Pulmonary effort is normal. No respiratory distress.     Breath sounds: Normal breath sounds.  Abdominal:     Palpations: Abdomen is soft.     Tenderness: There is no abdominal tenderness. There is no right CVA tenderness or left CVA tenderness.  Musculoskeletal:     Cervical back: Neck supple.  Skin:    General: Skin is dry.  Neurological:     General: No focal deficit present.     Mental Status: She is alert. Mental status is at baseline.     Motor: No weakness.     Gait: Gait normal.  Psychiatric:        Mood and Affect: Mood normal.        Behavior: Behavior normal.      UC Treatments / Results  Labs (all labs ordered are listed, but only abnormal results are displayed) Labs Reviewed  URINALYSIS, W/ REFLEX TO CULTURE (INFECTION SUSPECTED) - Abnormal; Notable for the following components:      Result Value   APPearance CLOUDY (*)    pH 8.5 (*)    Hgb urine dipstick MODERATE (*)    Protein, ur 100 (*)    Leukocytes,Ua MODERATE (*)    Bacteria, UA MANY (*)    All other components within normal limits  URINE CULTURE    EKG   Radiology No results found.  Procedures Procedures (including critical care time)  Medications Ordered in UC Medications - No data to display  Initial Impression / Assessment and Plan / UC Course  I have reviewed the triage vital signs and the nursing notes.  Pertinent labs & imaging results that were available during my care of the patient were reviewed by me and considered in my medical decision making (see chart  for details).   22 year old female presents for dysuria, urgency, frequency, lower back pain x 1 week.   Vitals normal and stable.  Abdomen soft and nontender.  No CVA tenderness.  UA shows cloudy urine with moderate hemoglobin, protein, moderate leukocytes and many bacteria.  Will send urine for culture.  This is consistent with UTI.  Treating at this time Macrobid.  Will amend treatment based on culture results if necessary.  Supportive care advised.  Work note given to patient request.   Final Clinical Impressions(s) / UC Diagnoses   Final diagnoses:  Acute cystitis without hematuria     Discharge Instructions      UTI: Based on either symptoms or urinalysis, you may have a urinary tract infection. We will send the urine for culture and call with results in a few days. Begin antibiotics at this time. Your symptoms should be much improved over the next 2-3 days. Increase rest and fluid intake. If for some reason symptoms are worsening or not improving after a couple of days or the urine culture determines the antibiotics you are taking will not treat the infection, the antibiotics may be changed. Return or go to ER for fever, back pain, worsening urinary pain, discharge, increased blood in urine. May take Tylenol or Motrin OTC for pain relief or consider AZO if no contraindications      ED Prescriptions     Medication Sig Dispense Auth. Provider   nitrofurantoin, macrocrystal-monohydrate, (MACROBID) 100 MG capsule Take 1 capsule (100 mg total) by mouth 2 (two) times daily. 10 capsule Shirlee Latch, PA-C      PDMP not reviewed this encounter.  Shirlee Latch, PA-C 01/15/23 1212

## 2023-01-15 NOTE — Discharge Instructions (Addendum)

## 2023-01-15 NOTE — ED Triage Notes (Signed)
Patient states that she is having urinary frequency, feels like she's not completely voiding and back pain x 1 week.

## 2023-01-17 LAB — URINE CULTURE: Culture: 30000 — AB

## 2023-03-03 ENCOUNTER — Other Ambulatory Visit: Payer: Self-pay

## 2023-03-03 ENCOUNTER — Ambulatory Visit (HOSPITAL_COMMUNITY)
Admission: EM | Admit: 2023-03-03 | Discharge: 2023-03-04 | Disposition: A | Discharge status: Home / Self Care | Payer: Medicaid Other | Attending: Psychiatry | Admitting: Psychiatry

## 2023-03-03 DIAGNOSIS — F332 Major depressive disorder, recurrent severe without psychotic features: Secondary | ICD-10-CM | POA: Diagnosis not present

## 2023-03-03 DIAGNOSIS — R45851 Suicidal ideations: Secondary | ICD-10-CM | POA: Diagnosis not present

## 2023-03-03 DIAGNOSIS — G4700 Insomnia, unspecified: Secondary | ICD-10-CM | POA: Insufficient documentation

## 2023-03-03 DIAGNOSIS — R632 Polyphagia: Secondary | ICD-10-CM | POA: Insufficient documentation

## 2023-03-03 DIAGNOSIS — F411 Generalized anxiety disorder: Secondary | ICD-10-CM | POA: Insufficient documentation

## 2023-03-03 DIAGNOSIS — Z91148 Patient's other noncompliance with medication regimen for other reason: Secondary | ICD-10-CM | POA: Insufficient documentation

## 2023-03-03 DIAGNOSIS — F909 Attention-deficit hyperactivity disorder, unspecified type: Secondary | ICD-10-CM | POA: Insufficient documentation

## 2023-03-03 DIAGNOSIS — Z9151 Personal history of suicidal behavior: Secondary | ICD-10-CM | POA: Insufficient documentation

## 2023-03-03 DIAGNOSIS — Z79899 Other long term (current) drug therapy: Secondary | ICD-10-CM | POA: Insufficient documentation

## 2023-03-03 LAB — CBC WITH DIFFERENTIAL/PLATELET
Abs Immature Granulocytes: 0.01 10*3/uL (ref 0.00–0.07)
Basophils Absolute: 0.1 10*3/uL (ref 0.0–0.1)
Basophils Relative: 1 %
Eosinophils Absolute: 0.2 10*3/uL (ref 0.0–0.5)
Eosinophils Relative: 2 %
HCT: 41.7 % (ref 36.0–46.0)
Hemoglobin: 14.4 g/dL (ref 12.0–15.0)
Immature Granulocytes: 0 %
Lymphocytes Relative: 42 %
Lymphs Abs: 2.9 10*3/uL (ref 0.7–4.0)
MCH: 30.8 pg (ref 26.0–34.0)
MCHC: 34.5 g/dL (ref 30.0–36.0)
MCV: 89.1 fL (ref 80.0–100.0)
Monocytes Absolute: 0.6 10*3/uL (ref 0.1–1.0)
Monocytes Relative: 9 %
Neutro Abs: 3.1 10*3/uL (ref 1.7–7.7)
Neutrophils Relative %: 46 %
Platelets: 250 10*3/uL (ref 150–400)
RBC: 4.68 MIL/uL (ref 3.87–5.11)
RDW: 12.7 % (ref 11.5–15.5)
WBC: 6.8 10*3/uL (ref 4.0–10.5)
nRBC: 0 % (ref 0.0–0.2)

## 2023-03-03 LAB — POCT URINE DRUG SCREEN - MANUAL ENTRY (I-SCREEN)
POC Amphetamine UR: NOT DETECTED
POC Buprenorphine (BUP): NOT DETECTED
POC Cocaine UR: NOT DETECTED
POC Marijuana UR: POSITIVE — AB
POC Methadone UR: NOT DETECTED
POC Methamphetamine UR: NOT DETECTED
POC Morphine: NOT DETECTED
POC Oxazepam (BZO): NOT DETECTED
POC Oxycodone UR: NOT DETECTED
POC Secobarbital (BAR): NOT DETECTED

## 2023-03-03 LAB — COMPREHENSIVE METABOLIC PANEL
ALT: 16 U/L (ref 0–44)
AST: 17 U/L (ref 15–41)
Albumin: 3.8 g/dL (ref 3.5–5.0)
Alkaline Phosphatase: 48 U/L (ref 38–126)
Anion gap: 11 (ref 5–15)
BUN: 7 mg/dL (ref 6–20)
CO2: 24 mmol/L (ref 22–32)
Calcium: 9.5 mg/dL (ref 8.9–10.3)
Chloride: 105 mmol/L (ref 98–111)
Creatinine, Ser: 0.9 mg/dL (ref 0.44–1.00)
GFR, Estimated: >60 mL/min (ref >60)
Glucose, Bld: 107 mg/dL — ABNORMAL HIGH (ref 70–99)
Potassium: 3.6 mmol/L (ref 3.5–5.1)
Sodium: 140 mmol/L (ref 135–145)
Total Bilirubin: 0.6 mg/dL (ref <1.2)
Total Protein: 7.4 g/dL (ref 6.5–8.1)

## 2023-03-03 LAB — POC URINE PREG, ED: Preg Test, Ur: NEGATIVE

## 2023-03-03 MED ORDER — MAGNESIUM HYDROXIDE 400 MG/5ML PO SUSP: 30.0000 mL | Freq: Every day | ORAL | Status: DC | PRN

## 2023-03-03 MED ORDER — HYDROXYZINE HCL 25 MG PO TABS
25.0000 mg | ORAL_TABLET | Freq: Three times a day (TID) | ORAL | Status: DC | PRN
  Administered 2023-03-03: 25 mg via ORAL
  Filled 2023-03-03: qty 1

## 2023-03-03 MED ORDER — ACETAMINOPHEN 325 MG PO TABS: 650.0000 mg | ORAL_TABLET | Freq: Four times a day (QID) | ORAL | Status: DC | PRN

## 2023-03-03 MED ORDER — LORAZEPAM 1 MG PO TABS: 1.0000 mg | ORAL_TABLET | ORAL | Status: DC | PRN

## 2023-03-03 MED ORDER — ESCITALOPRAM OXALATE 10 MG PO TABS
20.0000 mg | ORAL_TABLET | Freq: Every day | ORAL | Status: DC
  Administered 2023-03-03 – 2023-03-04 (×2): 20 mg via ORAL
  Filled 2023-03-03 (×2): qty 2

## 2023-03-03 MED ORDER — TRAZODONE HCL 50 MG PO TABS
50.0000 mg | ORAL_TABLET | Freq: Every evening | ORAL | Status: DC | PRN
  Administered 2023-03-03: 50 mg via ORAL
  Filled 2023-03-03: qty 1

## 2023-03-03 MED ORDER — ALUM & MAG HYDROXIDE-SIMETH 200-200-20 MG/5ML PO SUSP: 30.0000 mL | ORAL | Status: DC | PRN

## 2023-03-03 MED ORDER — OLANZAPINE 5 MG PO TBDP: 5.0000 mg | ORAL_TABLET | Freq: Three times a day (TID) | ORAL | Status: DC | PRN

## 2023-03-03 MED ORDER — ZIPRASIDONE MESYLATE 20 MG IM SOLR: 20.0000 mg | INTRAMUSCULAR | Status: DC | PRN

## 2023-03-03 NOTE — ED Notes (Signed)
Pt watching tv. Denies SI/ HI/AVH. States she is bored and she only needed outpt resources. She contracts for safety. No noted distress. Will continue to monitor for safety.
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
## 2023-03-03 NOTE — ED Notes (Addendum)
Patient admitted for observation. Patient is A&O x 4 cooperative with logical thought and coherent speech. Patient also presents with a nervous laugh with communication that is inconsistent with depressed mood. Patient states she had SI with out a plan this morning, felt worthless, hopeless and lonely. Patient states she is missing her grandam who is confined to a nursing center r/t a stroke. Patient states her grandma raised her and being without her during this time is hard. Patient denies SI, HI, AVH at this time. Patient states that she feels hopeful at this moment. Patient does not appear to be responding to internal stimuli. Patient has two tattoos to body, first are angle wings on right upper chest in memory of her uncle, second on left hip that says honey with two cherries present. Patient also observed to have erythema to full body with 3-5 second return of color once blanched. Patient states her skin is normally this way. Patient also has a red scratch to left upper arm,  left hand between 2nd and 3rd digit which she states is from her cat; old scar  to midline abdomen above navel from an old piercing; old scar to right knee, and scratch to right lower leg.
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
## 2023-03-03 NOTE — ED Notes (Signed)
The patient is lying in bed watching television in no acute distress, will continue to monitor patient for safety.

## 2023-03-03 NOTE — ED Provider Notes (Cosign Needed Addendum)
[facility] Continuous Assessment Admission H&P  Date: 03/03/23 Patient Name: Jessica Hebert MRN: [id number] Chief Complaint: "I am here for resources to deal with stress"  Diagnoses:  Final diagnoses:  Anxiety state  Severe episode of recurrent major depressive disorder, without psychotic features (HCC)  Insomnia, unspecified type    HPI: Jessica Hebert 22 y.o., female patient with a history of anxiety and depression presented to [facility] as a walk in voluntarily accompanied by mother with complaints of wanting resources to deal with stress.  Patient states that mother is one of her stressors because they are so close and she depends on her mother for everything.   Jessica Hebert, 22 y.o., female patient seen face to face by this provider, consulted with Dr. Enedina Finner; and chart reviewed on 03/03/23.  On evaluation Jessica Hebert  reports "I do not want to face this. I do not want to go to this place.  I want to live.  I can feel myself slipping".  Patient endorses thoughts of suicide as recent as this morning.  States that she thought about many ways but could not figure out how to carry out the act of suicide.  Patient has history of 2 suicide attempts, which required inpatient treatment.  Patient states that when she gets frustrated and cannot communicate with other people... she has just started to want to cut on her arms.  Patient denies homicidal ideations.  Patient denies AVH.    Patient endorses decreased energy levels crying spells, excessive worrying and irritability.  Patient states she has poor sleep only slept 4 hours last night.  Patient states her appetite fluctuates from eating very little to overeating.  Patient states that she has angry outburst and throws things.  Patient states she is in outpatient treatment with psychiatrist Williams Che with [facility].  Patient admits noncompliance with medication Lexapro and Adderall.  Patient states that she has been without medication for for  approximately 1 week as she was unable to pick it up from the pharmacy.  Patient endorses alcohol use socially.  Patient endorses the daily use of THC over-the-counter Gummies delta eights.  Patient denies any access to guns or legal issues.  Patient lives with her mother and her mother is present during this assessment.  Mom admits upon exiting that she did not want to speak freely in front of patient.  Mom states that a neighbor called the police this morning because they could hear the patient screaming and yelling inside the home.  Mom stated that the police took the [facility] and [facility] referred her to bring the patient here.  During evaluation Jessica Hebert is sitting in no acute distress.  She is alert, oriented x 4, calm, cooperative and attentive. Her mood is euthymic with congruent affect.  Patients speech became pressured , and with labile behaviors evidenced by laughing and then within minutes crying.  Objectively there is no evidence of psychosis/mania or delusional thinking.  Patient is able to converse coherently with goal directed thoughts. She endorses suicidal/self-harm.  She denies homicidal ideation.  Patient answered question appropriately.    Total Time spent with patient: 30 minutes  Musculoskeletal  Strength & Muscle Tone: within normal limits Gait & Station: normal Patient leans: N/A  Psychiatric Specialty Exam  Presentation General Appearance:  Appropriate for Environment  Eye Contact: Good  Speech: Clear and Coherent  Speech Volume: Normal  Handedness: Right   Mood and Affect  Mood: Labile  Affect: Non-Congruent   Thought Process  Thought Processes: Coherent  Descriptions of Associations:Intact  Orientation:Full (Time, Place and Person)  Thought Content:Logical  Diagnosis of Schizophrenia or Schizoaffective disorder in past: No   Hallucinations:Hallucinations: None  Ideas of Reference:None  Suicidal Thoughts:Suicidal Thoughts: Yes,  Active SI Active Intent and/or Plan: With Intent  Homicidal Thoughts:Homicidal Thoughts: No   Sensorium  Memory: Immediate Good; Recent Good  Judgment: Fair  Insight: Fair   Chartered certified accountant: Fair  Attention Span: Fair  Recall: Good  Fund of Knowledge: Good  Language: Good   Psychomotor Activity  Psychomotor Activity: Psychomotor Activity: Normal   Assets  Assets: Desire for Improvement   Sleep  Sleep: Sleep: Poor Number of Hours of Sleep: 4   Nutritional Assessment (For OBS and FBC admissions only) Has the patient had a weight loss or gain of 10 pounds or more in the last 3 months?: No Has the patient had a decrease in food intake/or appetite?: No Does the patient have dental problems?: No Does the patient have eating habits or behaviors that may be indicators of an eating disorder including binging or inducing vomiting?: No Has the patient recently lost weight without trying?: 0 Has the patient been eating poorly because of a decreased appetite?: 0 Malnutrition Screening Tool Score: 0    Physical Exam HENT:     Head: Normocephalic.     Nose: Nose normal.     Mouth/Throat:     Mouth: Mucous membranes are moist.  Eyes:     Extraocular Movements: Extraocular movements intact.  Cardiovascular:     Rate and Rhythm: Normal rate.  Pulmonary:     Effort: Pulmonary effort is normal.  Musculoskeletal:        General: Normal range of motion.     Cervical back: Normal range of motion.  Skin:    General: Skin is dry.  Neurological:     General: No focal deficit present.     Mental Status: She is alert.    Review of Systems  Constitutional: Negative.   HENT: Negative.    Eyes: Negative.   Respiratory: Negative.    Cardiovascular: Negative.   Gastrointestinal: Negative.   Genitourinary: Negative.   Musculoskeletal: Negative.   Skin: Negative.   Neurological: Negative.   Psychiatric/Behavioral:  Positive for depression  and suicidal ideas. The patient is nervous/anxious and has insomnia.     Blood pressure 125/76, pulse 91, temperature (!) 97.4 F (36.3 C), temperature source Oral, resp. rate 17, SpO2 100%. There is no height or weight on file to calculate BMI.  Past Psychiatric History: Patient has history of anxiety and depression  Is the patient at risk to self? Yes  Has the patient been a risk to self in the past 6 months? Yes .    Has the patient been a risk to self within the distant past? Yes   Is the patient a risk to others? No   Has the patient been a risk to others in the past 6 months? No   Has the patient been a risk to others within the distant past? No   Past Medical History: Patient has history of asthma, eczema and GERD  Family History: Mother has psychiatric history of depression and father has a history of drug abuse  Social History: Patient lives at home with mother.  Patient also endorses the use of alcohol socially and uses delta 8 Gummies daily.  Last Labs:  Admission on 03/03/2023  Component Date Value  Ref Range Status   Preg Test, Ur 03/03/2023 Negative  Negative Final   POC Amphetamine UR 03/03/2023 None Detected  NONE DETECTED (Cut Off Level 1000 ng/mL) Final   POC Secobarbital (BAR) 03/03/2023 None Detected  NONE DETECTED (Cut Off Level 300 ng/mL) Final   POC Buprenorphine (BUP) 03/03/2023 None Detected  NONE DETECTED (Cut Off Level 10 ng/mL) Final   POC Oxazepam (BZO) 03/03/2023 None Detected  NONE DETECTED (Cut Off Level 300 ng/mL) Final   POC Cocaine UR 03/03/2023 None Detected  NONE DETECTED (Cut Off Level 300 ng/mL) Final   POC Methamphetamine UR 03/03/2023 None Detected  NONE DETECTED (Cut Off Level 1000 ng/mL) Final   POC Morphine 03/03/2023 None Detected  NONE DETECTED (Cut Off Level 300 ng/mL) Final   POC Methadone UR 03/03/2023 None Detected  NONE DETECTED (Cut Off Level 300 ng/mL) Final   POC Oxycodone UR 03/03/2023 None Detected  NONE DETECTED (Cut Off Level  100 ng/mL) Final   POC Marijuana UR 03/03/2023 Positive (A)  NONE DETECTED (Cut Off Level 50 ng/mL) Final  Admission on 01/15/2023, Discharged on 01/15/2023  Component Date Value Ref Range Status   Specimen Source 01/15/2023 URINE, CLEAN CATCH   Final   Color, Urine 01/15/2023 YELLOW  YELLOW Final   APPearance 01/15/2023 CLOUDY (A)  CLEAR Final   Specific Gravity, Urine 01/15/2023 1.025  1.005 - 1.030 Final   pH 01/15/2023 8.5 (H)  5.0 - 8.0 Final   Glucose, UA 01/15/2023 NEGATIVE  NEGATIVE mg/dL Final   Hgb urine dipstick 01/15/2023 MODERATE (A)  NEGATIVE Final   Bilirubin Urine 01/15/2023 NEGATIVE  NEGATIVE Final   Ketones, ur 01/15/2023 NEGATIVE  NEGATIVE mg/dL Final   Protein, ur 04/54/0981 100 (A)  NEGATIVE mg/dL Final   Nitrite 19/14/7829 NEGATIVE  NEGATIVE Final   Leukocytes,Ua 01/15/2023 MODERATE (A)  NEGATIVE Final   Squamous Epithelial / HPF 01/15/2023 0-5  0 - 5 /HPF Final   WBC, UA 01/15/2023 >50  0 - 5 WBC/hpf Final   Reflex urine culture not performed if WBC <=10, OR if Squamous epithelial cells >5. If Squamous epithelial cells >5, suggest recollection.   RBC / HPF 01/15/2023 6-10  0 - 5 RBC/hpf Final   Bacteria, UA 01/15/2023 MANY (A)  NONE SEEN Final   WBC Clumps 01/15/2023 PRESENT   Final   Performed at [facility], [address] [place], [place] [address]   Specimen Description 01/15/2023    Final                   Value:URINE, RANDOM Performed at [facility], [address], [place], [place] [address]    Special Requests 01/15/2023    Final                   Value:NONE Reflexed from [id number] Performed at [facility], [address], [place], [place] [address]    Culture 01/15/2023 30,000 COLONIES/mL ESCHERICHIA COLI (A)   Final   Report Status 01/15/2023 01/17/2023 FINAL   Final   Organism ID, Bacteria 01/15/2023 ESCHERICHIA COLI (A)   Final  Clinical Support on 11/27/2022  Component Date Value Ref Range Status   TB Skin  Test 11/30/2022 Negative   Final   Induration 11/30/2022 0  mm Final  Admission on 09/12/2022, Discharged on 09/14/2022  Component Date Value Ref Range Status   Sodium 09/12/2022 142  135 - 145 mmol/L Final   Potassium 09/12/2022 3.4 (L)  3.5 -  5.1 mmol/L Final   Chloride 09/12/2022 112 (H)  98 - 111 mmol/L Final   CO2 09/12/2022 22  22 - 32 mmol/L Final   Glucose, Bld 09/12/2022 92  70 - 99 mg/dL Final   Glucose reference range applies only to samples taken after fasting for at least 8 hours.   BUN 09/12/2022 7  6 - 20 mg/dL Final   Creatinine, Ser 09/12/2022 0.92  0.44 - 1.00 mg/dL Final   Calcium 21/30/8657 8.6 (L)  8.9 - 10.3 mg/dL Final   Total Protein 84/69/6295 6.9  6.5 - 8.1 g/dL Final   Albumin 28/41/3244 3.5  3.5 - 5.0 g/dL Final   AST 03/11/7251 15  15 - 41 U/L Final   ALT 09/12/2022 13  0 - 44 U/L Final   Alkaline Phosphatase 09/12/2022 47  38 - 126 U/L Final   Total Bilirubin 09/12/2022 0.3  0.3 - 1.2 mg/dL Final   GFR, Estimated 09/12/2022 >60  >60 mL/min Final   Comment: (NOTE) Calculated using the CKD-EPI Creatinine Equation (2021)    Anion gap 09/12/2022 8  5 - 15 Final   Performed at [facility], [address] [place], [place] [address]   Alcohol, Ethyl (B) 09/12/2022 120 (H)  <10 mg/dL Final   Comment: (NOTE) Lowest detectable limit for serum alcohol is 10 mg/dL.  For medical purposes only. Performed at [facility], [address] [place], [place] [address]    Salicylate Lvl 09/12/2022 <7.0 (L)  7.0 - 30.0 mg/dL Final   Performed at [facility], [address] [place], [place] [address]   Acetaminophen (Tylenol), Serum 09/12/2022 <10 (L)  10 - 30 ug/mL Final   Comment: (NOTE) Therapeutic concentrations vary significantly. A range of 10-30 ug/mL  may be an effective concentration for many patients. However, some  are best treated at concentrations outside of this range. Acetaminophen concentrations >150 ug/mL at 4 hours  after ingestion  and >50 ug/mL at 12 hours after ingestion are often associated with  toxic reactions.  Performed at [facility], [address] [place], [place] [address]    WBC 09/12/2022 6.4  4.0 - 10.5 K/uL Final   RBC 09/12/2022 4.85  3.87 - 5.11 MIL/uL Final   Hemoglobin 09/12/2022 14.8  12.0 - 15.0 g/dL Final   HCT 33/29/5188 43.1  36.0 - 46.0 % Final   MCV 09/12/2022 88.9  80.0 - 100.0 fL Final   MCH 09/12/2022 30.5  26.0 - 34.0 pg Final   MCHC 09/12/2022 34.3  30.0 - 36.0 g/dL Final   RDW 41/66/0630 12.3  11.5 - 15.5 % Final   Platelets 09/12/2022 265  150 - 400 K/uL Final   nRBC 09/12/2022 0.0  0.0 - 0.2 % Final   Performed at [facility], [address] [place], [place] [address]   Tricyclic, Ur Screen 09/12/2022 NONE DETECTED  NONE DETECTED Final   Amphetamines, Ur Screen 09/12/2022 NONE DETECTED  NONE DETECTED Final   MDMA (Ecstasy)Ur Screen 09/12/2022 NONE DETECTED  NONE DETECTED Final   Cocaine Metabolite,Ur [place] 09/12/2022 NONE DETECTED  NONE DETECTED Final   Opiate, Ur Screen 09/12/2022 NONE DETECTED  NONE DETECTED Final   Phencyclidine (PCP) Ur S 09/12/2022 NONE DETECTED  NONE DETECTED Final   Cannabinoid 50 Ng, Ur [place] 09/12/2022 NONE DETECTED  NONE DETECTED Final   Barbiturates, Ur Screen 09/12/2022 NONE DETECTED  NONE DETECTED Final   Benzodiazepine, Ur Scrn 09/12/2022 NONE DETECTED  NONE DETECTED Final  Methadone Scn, Ur 09/12/2022 NONE DETECTED  NONE DETECTED Final   Comment: (NOTE) Tricyclics + metabolites, urine    Cutoff 1000 ng/mL Amphetamines + metabolites, urine  Cutoff 1000 ng/mL MDMA (Ecstasy), urine              Cutoff 500 ng/mL Cocaine Metabolite, urine          Cutoff 300 ng/mL Opiate + metabolites, urine        Cutoff 300 ng/mL Phencyclidine (PCP), urine         Cutoff 25 ng/mL Cannabinoid, urine                 Cutoff 50 ng/mL Barbiturates + metabolites, urine  Cutoff 200 ng/mL Benzodiazepine, urine              Cutoff 200  ng/mL Methadone, urine                   Cutoff 300 ng/mL  The urine drug screen provides only a preliminary, unconfirmed analytical test result and should not be used for non-medical purposes. Clinical consideration and professional judgment should be applied to any positive drug screen result due to possible interfering substances. A more specific alternate chemical method must be used in order to obtain a confirmed analytical result. Gas chromatography / mass spectrometry (GC/MS) is the preferred confirm                          atory method. Performed at [facility], [address] [place], [place] [address]    Preg Test, Ur 09/12/2022 Negative  Negative Final   Acetaminophen (Tylenol), Serum 09/13/2022 <10 (L)  10 - 30 ug/mL Final   Comment: (NOTE) Therapeutic concentrations vary significantly. A range of 10-30 ug/mL  may be an effective concentration for many patients. However, some  are best treated at concentrations outside of this range. Acetaminophen concentrations >150 ug/mL at 4 hours after ingestion  and >50 ug/mL at 12 hours after ingestion are often associated with  toxic reactions.  Performed at [facility], [address] [place], [place] [address]     Allergies: Grapeseed extract [nutritional supplements], Egg-derived products, Other, and Pollen extract  Medications:  Facility Ordered Medications  Medication   acetaminophen (TYLENOL) tablet 650 mg   alum & mag hydroxide-simeth (MAALOX/MYLANTA) 200-200-20 MG/5ML suspension 30 mL   magnesium hydroxide (MILK OF MAGNESIA) suspension 30 mL   hydrOXYzine (ATARAX) tablet 25 mg   traZODone (DESYREL) tablet 50 mg   OLANZapine zydis (ZYPREXA) disintegrating tablet 5 mg   And   LORazepam (ATIVAN) tablet 1 mg   And   ziprasidone (GEODON) injection 20 mg   escitalopram (LEXAPRO) tablet 20 mg   PTA Medications  Medication Sig   etonogestrel-ethinyl estradiol (NUVARING) 0.12-0.015 MG/24HR  vaginal ring INSERT 1 RING IN THE VAGINA AND LEAVE IN PLACE FOR 3 CONSECUTIVE WEEKS, THEN REMOVE FOR 1 WEEK AS DIRECTED.   cetirizine (ZYRTEC ALLERGY) 10 MG tablet Take 1 tablet (10 mg total) by mouth daily. (Patient not taking: Reported on 09/13/2022)   escitalopram (LEXAPRO) 20 MG tablet Take 20 mg by mouth daily.   budesonide-formoterol (SYMBICORT) 160-4.5 MCG/ACT inhaler Inhale 2 puffs into the lungs 2 (two) times daily.   amphetamine-dextroamphetamine (ADDERALL XR) 15 MG 24 hr capsule Take by mouth.   hydrOXYzine (ATARAX) 25 MG tablet Take 25 mg by mouth daily as needed for anxiety.   predniSONE (DELTASONE) 10 MG tablet Take 1  tablet (10 mg total) by mouth daily. 6,5,4,3,2,1 six day taper (Patient not taking: Reported on 11/27/2022)   guaiFENesin (MUCINEX) 600 MG 12 hr tablet Take 2 tablets (1,200 mg total) by mouth 2 (two) times daily as needed. (Patient not taking: Reported on 11/27/2022)   nitrofurantoin, macrocrystal-monohydrate, (MACROBID) 100 MG capsule Take 1 capsule (100 mg total) by mouth 2 (two) times daily.      Medical Decision Making  Patient poses a risk of imminent danger to self.     Patient recommended for inpatient treatment/continuous observation for crisis stabilization, mood stabilization and medication management.  Restarted patient home medication Lexapro.  Verbal consent obtained.      Recommendations  Based on my evaluation the patient does not appear to have an emergency medical condition.  Patient admitted to continuous assessment.  Patient will be reassessed tomorrow to determine appropriate placement.  De Burrs, NP 03/03/23  5:05 PM

## 2023-03-04 NOTE — ED Notes (Addendum)
Patient discharged home in no acute distress. At the time of discharge pt denies SI/HI or AVH. Pt's belongings returned from locker #21. Patient escorted to the front lobby by staff where her sister was awaiting to provide transportation home. Safety maintained.

## 2023-03-04 NOTE — ED Notes (Signed)
Pt is currently sleeping, no distress noted, environmental check complete, will continue to monitor patient for safety.  

## 2023-03-04 NOTE — Discharge Instructions (Addendum)
Discharge recommendations:   Medications: Patient is to take medications as prescribed. The patient or patient's guardian is to contact a medical professional and/or outpatient provider to address any new side effects that develop. The patient or the patient's guardian should update outpatient providers of any new medications and/or medication changes.   Outpatient Follow up: You are encouraged to follow up with [facility] for outpatient treatment. Your appointment is scheduled for 05/24/23 at 1 pm. If you are interested in medication management you may attend open access to establish treatment. If you need to see a counselor prior to your scheduled appointment you may attend open access for counseling.   Walk in/ Open Access Hours: Monday - Friday 8AM - 11AM (To see provider and therapist) Friday - 1PM - 4PM (To see therapist only)  [facility] [address] [place], [place] [phone]  Therapy: We recommend that patient participate in individual therapy to address mental health concerns.  Safety:   The following safety precautions should be taken:   No sharp objects. This includes scissors, razors, scrapers, and putty knives.   Chemicals should be removed and locked up.   Medications should be removed and locked up.   Weapons should be removed and locked up. This includes firearms, knives and instruments that can be used to cause injury.   The patient should abstain from use of illicit substances/drugs and abuse of any medications.  If symptoms worsen or do not continue to improve or if the patient becomes actively suicidal or homicidal then it is recommended that the patient return to the closest hospital emergency department, the [facility], or call 911 for further evaluation and treatment. National Suicide Prevention Lifeline 1-800-SUICIDE or (610) 429-8275.  About 988 988 offers 24/7 access to trained  crisis counselors who can help people experiencing mental health-related distress. People can call or text 988 or chat 988lifeline.org for themselves or if they are worried about a loved one who may need crisis support.

## 2023-03-04 NOTE — ED Notes (Signed)
Patient observed resting quietly, eyes closed. Respirations equal and unlabored. Will continue to monitor for safety.  

## 2023-03-04 NOTE — ED Provider Notes (Signed)
FBC/OBS ASAP Discharge Summary  Date and Time: 03/04/2023 8:30 AM  Name: Jessica Hebert  MRN:  [id number]   Discharge Diagnoses:  Final diagnoses:  Anxiety state  Severe episode of recurrent major depressive disorder, without psychotic features (HCC)  Insomnia, unspecified type    Subjective: Patient seen and reevaluated face to face by this provider, chart reviewed and case discussed with Dr. Enedina Finner. On evaluation, patient is lying down in bed awake in no acute distress. Patient states that she's been under a lot of stress because her grandmother was recently placed in a nursing home. She states that this holiday was the first Christmas she spent without her grandmother. She states that it is hard for her to interact with her grandmother because she cannot communicate. She states that her grandmother lived with her, her whole life. She states that she would like to have someone to talk to because her mother doesn't like to talk. She states that she would like to get a therapist to have someone to talk to. She denies SI and states that she wants to live. She denies feeling suicidal yesterday and states that it is "hard to explain" how she was feeling but she does not want to die. She identifies protective factors as her animals, family, mother, sister and grandmother, and working at [employer] which is an outlet for her. She denies HI. She denies AVH. There is no objective evidence that the patient is responding to internal or external stimuli. She describes her mood as feeling anxious about being in an unfamiliar place. She reports fair sleep. She reports a good appetite. She denies physical complaints. Patient is receptive to establishing counseling here at the [facility], patient is scheduled an appointment for therapy on May 24, 2023. Patient encouraged to attend open access walk-in hours here at the [facility] if she needs to see a counselor sooner. Patient agreeable to  stated plan of care. Patient identifies her mother and sister as her support persons. Patient advised that if her symptoms worsen she can return back to the [facility], nearest ED or call 911 for crisis assessment. Patient verbalizes understanding. Patient verbalizes feeling safe to return home and verbally contracts for safety. Patient denies access to firearms.   I spoke to the patient's mother Randa Evens [phone] who denies safety concerns with the patient returning home today. She confirms that there are no firearms in the home. I also highly recommended that the patient should not have access to sharp objects for medications in the home. The patient's mother states that she would like to manage the patient's medications. Patient agreeable to allowing her mother to manage her medications at home. Patient's mother also voiced that the patient sees a child psychiatrist and needs to find a new psychiatrist for her medications. She asked if the patient's Adderall could be refilled. She was advised that the patient's current prescriber or psychiatrist would need to refilled the patient's Adderral. I also discussed that if the patient's symptoms worsen, patient may return to the [facility], nearest ED or call 911 for crisis assessment.    Stay Summary: Jessica Hebert is 22 year old female patient with a past psychiatric history of MDD, panic disorder, Bulimia Nervosa, ADHD and Anxiety who presented to the [facility] voluntary accompanied by her mother requesting resources for outpatient services to help deal with stress.   Patient was admitted to the [facility] behavioral health urgent care continuous assessment unit for overnight observation.  Patient was observed overnight without any disruptive, aggressive, psychotic, self-harm behaviors or significant changes in mood. Patient was restarted on home medication Lexapro 20 mg p.o. daily for depression and has tolerated the medication without any noticeable  side effects.  On reevaluation 03/04/2023, patient denies SI/HI/AVH. Patient did not meet criteria for inpatient psychiatric treatment or [facility] IVC criteria. Patient verbalized readiness to discharge and return back home with her mother with resources to follow up with outpatient counseling. Patient was scheduled a follow-up appointment for counseling for May 24, 2023. Patient was also recommended to follow up during open access walk-in hours if she would like to get establish with outpatient services for medication management here at the [facility].   Total Time spent with patient: 30 minutes  Past Psychiatric History: Per chart review, history of MDD, panic disorder, Bulimia Nervosa, ADHD and Anxiety. Patient hospitalized at [facility] 09/14/22 to 09/17/22 for suicide attempt by OD and at [facility] on 03/24/22 for suicide attempt by OD.  Past Medical History: Per chart review, asthma, overweight and migraine.  Family Psychiatric History: Per chart review, mother hx of depression and father history of drug abuse.   Social History: Patient resides with her mother. No access to firearms in the home. Patient is employed at [employer].   Tobacco Cessation:  N/A, patient does not currently use tobacco products  Current Medications:  Current Facility-Administered Medications  Medication Dose Route Frequency Provider Last Rate Last Admin   acetaminophen (TYLENOL) tablet 650 mg  650 mg Oral Q6H PRN McLauchlin, Angela, NP       alum & mag hydroxide-simeth (MAALOX/MYLANTA) 200-200-20 MG/5ML suspension 30 mL  30 mL Oral Q4H PRN McLauchlin, Marylene Land, NP       escitalopram (LEXAPRO) tablet 20 mg  20 mg Oral Daily McLauchlin, Angela, NP   20 mg at 03/03/23 1757   hydrOXYzine (ATARAX) tablet 25 mg  25 mg Oral TID PRN McLauchlin, Marylene Land, NP   25 mg at 03/03/23 2120   OLANZapine zydis (ZYPREXA) disintegrating tablet 5 mg  5 mg Oral Q8H PRN McLauchlin, Angela, NP        And   LORazepam (ATIVAN) tablet 1 mg  1 mg Oral PRN McLauchlin, Angela, NP       And   ziprasidone (GEODON) injection 20 mg  20 mg Intramuscular PRN McLauchlin, Angela, NP       magnesium hydroxide (MILK OF MAGNESIA) suspension 30 mL  30 mL Oral Daily PRN McLauchlin, Angela, NP       traZODone (DESYREL) tablet 50 mg  50 mg Oral QHS PRN McLauchlin, Angela, NP   50 mg at 03/03/23 2120   Current Outpatient Medications  Medication Sig Dispense Refill   amphetamine-dextroamphetamine (ADDERALL XR) 15 MG 24 hr capsule Take 15 mg by mouth daily.     budesonide-formoterol (SYMBICORT) 160-4.5 MCG/ACT inhaler Inhale 2 puffs into the lungs 2 (two) times daily.     escitalopram (LEXAPRO) 20 MG tablet Take 20 mg by mouth daily.     etonogestrel-ethinyl estradiol (NUVARING) 0.12-0.015 MG/24HR vaginal ring INSERT 1 RING IN THE VAGINA AND LEAVE IN PLACE FOR 3 CONSECUTIVE WEEKS, THEN REMOVE FOR 1 WEEK AS DIRECTED. 3 each 3   hydrOXYzine (ATARAX) 25 MG tablet Take 25 mg by mouth daily as needed for anxiety.      PTA Medications:  Facility Ordered Medications  Medication   acetaminophen (TYLENOL) tablet 650 mg   alum & mag hydroxide-simeth (MAALOX/MYLANTA) 200-200-20 MG/5ML  suspension 30 mL   magnesium hydroxide (MILK OF MAGNESIA) suspension 30 mL   hydrOXYzine (ATARAX) tablet 25 mg   traZODone (DESYREL) tablet 50 mg   OLANZapine zydis (ZYPREXA) disintegrating tablet 5 mg   And   LORazepam (ATIVAN) tablet 1 mg   And   ziprasidone (GEODON) injection 20 mg   escitalopram (LEXAPRO) tablet 20 mg   PTA Medications  Medication Sig   amphetamine-dextroamphetamine (ADDERALL XR) 15 MG 24 hr capsule Take 15 mg by mouth daily.   etonogestrel-ethinyl estradiol (NUVARING) 0.12-0.015 MG/24HR vaginal ring INSERT 1 RING IN THE VAGINA AND LEAVE IN PLACE FOR 3 CONSECUTIVE WEEKS, THEN REMOVE FOR 1 WEEK AS DIRECTED.   escitalopram (LEXAPRO) 20 MG tablet Take 20 mg by mouth daily.   budesonide-formoterol (SYMBICORT)  160-4.5 MCG/ACT inhaler Inhale 2 puffs into the lungs 2 (two) times daily.   hydrOXYzine (ATARAX) 25 MG tablet Take 25 mg by mouth daily as needed for anxiety.       03/03/2023    4:21 PM  Depression screen PHQ 2/9  Decreased Interest 2  Down, Depressed, Hopeless 2  PHQ - 2 Score 4  Altered sleeping 2  Tired, decreased energy 1  Change in appetite 1  Feeling bad or failure about yourself  2  Trouble concentrating 1  Moving slowly or fidgety/restless 2  Suicidal thoughts 1  PHQ-9 Score 14  Difficult doing work/chores Very difficult    Flowsheet Row ED from 03/03/2023 in [facility] ED from 01/15/2023 in [facility]  ED from 10/27/2022 in [facility] at [facility]   C-SSRS RISK CATEGORY Low Risk No Risk No Risk       Musculoskeletal  Strength & Muscle Tone: within normal limits Gait & Station: normal Patient leans: N/A  Psychiatric Specialty Exam  Presentation  General Appearance:  Appropriate for Environment  Eye Contact: Fair  Speech: Clear and Coherent  Speech Volume: Normal  Handedness: Right   Mood and Affect  Mood: Anxious  Affect: Appropriate   Thought Process  Thought Processes: Coherent  Descriptions of Associations:Intact  Orientation:Full (Time, Place and Person)  Thought Content:Logical  Diagnosis of Schizophrenia or Schizoaffective disorder in past: No    Hallucinations:Hallucinations: None  Ideas of Reference:None  Suicidal Thoughts:No  Homicidal Thoughts:Homicidal Thoughts: No   Sensorium  Memory: Immediate Fair; Recent Fair; Remote Fair  Judgment: Fair  Insight: Fair   Art therapist  Concentration: Fair  Attention Span: Fair  Recall: [employer] of Knowledge: Fair  Language: Fair   Psychomotor Activity  Psychomotor Activity: Psychomotor Activity: Normal   Assets  Assets: Communication Skills; Desire for Improvement; Financial  Resources/Insurance; Housing; Leisure Time; Physical Health; Social Support   Sleep  Sleep: Fair   Nutritional Assessment (For OBS and FBC admissions only) Has the patient had a weight loss or gain of 10 pounds or more in the last 3 months?: No Has the patient had a decrease in food intake/or appetite?: No Does the patient have dental problems?: No Does the patient have eating habits or behaviors that may be indicators of an eating disorder including binging or inducing vomiting?: No Has the patient recently lost weight without trying?: 0 Has the patient been eating poorly because of a decreased appetite?: 0 Malnutrition Screening Tool Score: 0    Physical Exam  Physical Exam Cardiovascular:     Rate and Rhythm: Normal rate.  Pulmonary:     Effort: Pulmonary effort is normal.  Musculoskeletal:        General: Normal range of motion.  Neurological:     Mental Status: She is alert and oriented to person, place, and time.    Review of Systems  Constitutional: Negative.   HENT: Negative.    Eyes: Negative.   Respiratory: Negative.    Cardiovascular: Negative.   Genitourinary: Negative.   Musculoskeletal: Negative.   Neurological: Negative.   Endo/Heme/Allergies: Negative.    Blood pressure 121/76, pulse 81, temperature 98.4 F (36.9 C), temperature source Oral, resp. rate 18, SpO2 98%. There is no height or weight on file to calculate BMI.  Demographic Factors:  Adolescent or young adult and Caucasian  Loss Factors: Loss of significant relationship  Historical Factors: Prior suicide attempts and Impulsivity  Risk Reduction Factors:   Sense of responsibility to family, Employed, Living with another person, especially a relative, and Positive social support  Continued Clinical Symptoms:  Previous Psychiatric Diagnoses and Treatments  Cognitive Features That Contribute To Risk:  None    Suicide Risk:  Minimal: No identifiable suicidal ideation.  Patients  presenting with no risk factors but with morbid ruminations; may be classified as minimal risk based on the severity of the depressive symptoms  Plan Of Care/Discharge recommendations:   Medications: Patient is to take medications as prescribed. The patient or patient's guardian is to contact a medical professional and/or outpatient provider to address any new side effects that develop. The patient or the patient's guardian should update outpatient providers of any new medications and/or medication changes.   Outpatient Follow up: You are encouraged to follow up with [facility] for outpatient treatment. Your appointment is scheduled for 05/24/23 at 1 pm. If you are interested in medication management you may attend open access to establish treatment. If you need to see a counselor prior to your scheduled appointment you may attend open access for counseling.   Walk in/ Open Access Hours: Monday - Friday 8AM - 11AM (To see provider and therapist) Friday - 1PM - 4PM (To see therapist only)  [facility] [address] [place], [place] [phone]  Therapy: We recommend that patient participate in individual therapy to address mental health concerns.  Safety:   The following safety precautions should be taken:   No sharp objects. This includes scissors, razors, scrapers, and putty knives.   Chemicals should be removed and locked up.   Medications should be removed and locked up.   Weapons should be removed and locked up. This includes firearms, knives and instruments that can be used to cause injury.   The patient should abstain from use of illicit substances/drugs and abuse of any medications.  If symptoms worsen or do not continue to improve or if the patient becomes actively suicidal or homicidal then it is recommended that the patient return to the closest hospital emergency department, the [facility], or call 911  for further evaluation and treatment. National Suicide Prevention Lifeline 1-800-SUICIDE or 347-861-7090.  About 988 988 offers 24/7 access to trained crisis counselors who can help people experiencing mental health-related distress. People can call or text 988 or chat 988lifeline.org for themselves or if they are worried about a loved one who may need crisis support.    Disposition Discharge home.   Erek Kowal L, NP 03/04/2023, 8:30 AM

## 2023-03-04 NOTE — ED Notes (Signed)
AVS given and reviewed with pt. Understanding was voiced. Patient calling her mom now for transportation home.

## 2023-03-04 NOTE — ED Notes (Signed)
Patient is alert and oriented. She denies SI/HI or AVH. Patient is pleasant, calm, and cooperative. Patient is forward thinking, and looking forward to discharging this morning. Mood is happy, affect congruent. She denies physical pain or discomfort. She denies any needs at this time. Will continue to monitor for safety.

## 2023-03-04 NOTE — ED Notes (Signed)
Pt observed/assessed in recliner sleeping. RR even and unlabored, appearing in no noted distress. Environmental check complete, will continue to monitor for safety 

## 2023-03-05 LAB — HEMOGLOBIN A1C
Hgb A1c MFr Bld: 5.2 % (ref 4.8–5.6)
Mean Plasma Glucose: 103 mg/dL

## 2023-03-05 LAB — POCT PREGNANCY, URINE: Preg Test, Ur: NEGATIVE

## 2023-03-24 ENCOUNTER — Emergency Department: Payer: Medicaid Other

## 2023-03-24 ENCOUNTER — Other Ambulatory Visit: Payer: Self-pay

## 2023-03-24 ENCOUNTER — Encounter: Payer: Self-pay | Admitting: Emergency Medicine

## 2023-03-24 ENCOUNTER — Emergency Department
Admission: EM | Admit: 2023-03-24 | Discharge: 2023-03-24 | Disposition: A | Discharge status: Home / Self Care | Payer: Medicaid Other | Attending: Emergency Medicine | Admitting: Emergency Medicine

## 2023-03-24 DIAGNOSIS — S0990XA Unspecified injury of head, initial encounter: Secondary | ICD-10-CM | POA: Diagnosis present

## 2023-03-24 DIAGNOSIS — J45909 Unspecified asthma, uncomplicated: Secondary | ICD-10-CM | POA: Insufficient documentation

## 2023-03-24 DIAGNOSIS — Y92410 Unspecified street and highway as the place of occurrence of the external cause: Secondary | ICD-10-CM | POA: Diagnosis not present

## 2023-03-24 DIAGNOSIS — S060X0A Concussion without loss of consciousness, initial encounter: Secondary | ICD-10-CM | POA: Insufficient documentation
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
## 2023-03-24 NOTE — ED Notes (Signed)
Pt verbalized understanding of discharge instructions. Opportunity for questions provided. Work note provided.  

## 2023-03-24 NOTE — Discharge Instructions (Addendum)
 Your CT scan does not show any internal bleeding or worrisome trauma to your brain.  It incidentally notes obstruction of sinuses on your left side, and you should see ENT for further evaluation of this finding. You can take ibuprofen  600mg  every 6 hours as needed for headache.   IMPRESSION:  1. No acute/traumatic finding.  2. 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 Recommend ENT follow-up.  3. Partial bilateral mastoid opacification.
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
## 2023-05-24 ENCOUNTER — Ambulatory Visit (HOSPITAL_COMMUNITY): Payer: 59 | Admitting: Clinical

## 2023-06-30 NOTE — Progress Notes (Signed)
 Writing for refills of escitalopram  and Adderall XR 15 mg.

## 2023-08-18 ENCOUNTER — Other Ambulatory Visit: Payer: Self-pay

## 2023-08-18 ENCOUNTER — Emergency Department
Admission: EM | Admit: 2023-08-18 | Discharge: 2023-08-18 | Discharge status: Against Medical Advice | Attending: Emergency Medicine | Admitting: Emergency Medicine

## 2023-08-18 ENCOUNTER — Telehealth: Payer: Self-pay | Admitting: Emergency Medicine

## 2023-08-18 DIAGNOSIS — Z5321 Procedure and treatment not carried out due to patient leaving prior to being seen by health care provider: Secondary | ICD-10-CM | POA: Diagnosis not present

## 2023-08-18 DIAGNOSIS — Y903 Blood alcohol level of 60-79 mg/100 ml: Secondary | ICD-10-CM | POA: Diagnosis not present

## 2023-08-18 DIAGNOSIS — F1010 Alcohol abuse, uncomplicated: Secondary | ICD-10-CM | POA: Insufficient documentation

## 2023-08-18 LAB — COMPREHENSIVE METABOLIC PANEL WITH GFR
ALT: 22 U/L (ref 0–44)
AST: 21 U/L (ref 15–41)
Albumin: 4.6 g/dL (ref 3.5–5.0)
Alkaline Phosphatase: 59 U/L (ref 38–126)
Anion gap: 9 (ref 5–15)
BUN: 10 mg/dL (ref 6–20)
CO2: 23 mmol/L (ref 22–32)
Calcium: 9.4 mg/dL (ref 8.9–10.3)
Chloride: 108 mmol/L (ref 98–111)
Creatinine, Ser: 0.97 mg/dL (ref 0.44–1.00)
GFR, Estimated: >60 mL/min (ref >60)
Glucose, Bld: 105 mg/dL — ABNORMAL HIGH (ref 70–99)
Potassium: 3.8 mmol/L (ref 3.5–5.1)
Sodium: 140 mmol/L (ref 135–145)
Total Bilirubin: 0.6 mg/dL (ref 0.0–1.2)
Total Protein: 8.2 g/dL — ABNORMAL HIGH (ref 6.5–8.1)

## 2023-08-18 LAB — CBC
HCT: 45.5 % (ref 36.0–46.0)
Hemoglobin: 15.8 g/dL — ABNORMAL HIGH (ref 12.0–15.0)
MCH: 31.5 pg (ref 26.0–34.0)
MCHC: 34.7 g/dL (ref 30.0–36.0)
MCV: 90.6 fL (ref 80.0–100.0)
Platelets: 310 10*3/uL (ref 150–400)
RBC: 5.02 MIL/uL (ref 3.87–5.11)
RDW: 12.8 % (ref 11.5–15.5)
WBC: 11.3 10*3/uL — ABNORMAL HIGH (ref 4.0–10.5)
nRBC: 0 % (ref 0.0–0.2)

## 2023-08-18 LAB — ETHANOL: Alcohol, Ethyl (B): 72 mg/dL — ABNORMAL HIGH (ref <15)

## 2023-08-18 NOTE — Telephone Encounter (Signed)
 I called patient this am, and the number did not work.  I called parent number and that one says it cannot take calls.  I have called [place] police department and given her address so they can go and check on her due to her cc of suicidal thoughts.  I have asked them to let her know that we are concerned about her and that she can return here or go to [facility].

## 2023-08-18 NOTE — ED Notes (Signed)
 Pt belongings:  Black tank top [employer]

## 2023-08-18 NOTE — ED Triage Notes (Signed)
 Pt reports for the past few days she has had thoughts of SI. Pt has hx depression and has been taking prescribed medications. Pt tearful in triage, states she cut herself earlier today. Pt has superficial cuts to left wrist. Pt states she has also been having panic attacks today

## 2024-02-03 ENCOUNTER — Emergency Department

## 2024-02-03 ENCOUNTER — Emergency Department
Admission: EM | Admit: 2024-02-03 | Discharge: 2024-02-04 | Disposition: A | Discharge status: Home / Self Care | Attending: Emergency Medicine | Admitting: Emergency Medicine

## 2024-02-03 ENCOUNTER — Other Ambulatory Visit: Payer: Self-pay

## 2024-02-03 DIAGNOSIS — R45851 Suicidal ideations: Secondary | ICD-10-CM | POA: Insufficient documentation

## 2024-02-03 DIAGNOSIS — Y903 Blood alcohol level of 60-79 mg/100 ml: Secondary | ICD-10-CM | POA: Diagnosis not present

## 2024-02-03 DIAGNOSIS — F10920 Alcohol use, unspecified with intoxication, uncomplicated: Secondary | ICD-10-CM

## 2024-02-03 DIAGNOSIS — F419 Anxiety disorder, unspecified: Secondary | ICD-10-CM | POA: Insufficient documentation

## 2024-02-03 DIAGNOSIS — F430 Acute stress reaction: Secondary | ICD-10-CM | POA: Insufficient documentation

## 2024-02-03 DIAGNOSIS — Z7951 Long term (current) use of inhaled steroids: Secondary | ICD-10-CM | POA: Diagnosis not present

## 2024-02-03 DIAGNOSIS — F10129 Alcohol abuse with intoxication, unspecified: Secondary | ICD-10-CM | POA: Diagnosis not present

## 2024-02-03 DIAGNOSIS — T7691XA Unspecified adult maltreatment, suspected, initial encounter: Secondary | ICD-10-CM | POA: Diagnosis not present

## 2024-02-03 DIAGNOSIS — F329 Major depressive disorder, single episode, unspecified: Secondary | ICD-10-CM | POA: Diagnosis not present

## 2024-02-03 DIAGNOSIS — S0990XA Unspecified injury of head, initial encounter: Secondary | ICD-10-CM | POA: Insufficient documentation

## 2024-02-03 DIAGNOSIS — J45909 Unspecified asthma, uncomplicated: Secondary | ICD-10-CM | POA: Insufficient documentation

## 2024-02-03 MED ORDER — IBUPROFEN 800 MG PO TABS
800.0000 mg | ORAL_TABLET | Freq: Once | ORAL | Status: AC
  Administered 2024-02-03: 800 mg via ORAL
  Filled 2024-02-03: qty 1
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
## 2024-02-04 DIAGNOSIS — T7691XA Unspecified adult maltreatment, suspected, initial encounter: Secondary | ICD-10-CM | POA: Diagnosis not present

## 2024-02-04 DIAGNOSIS — R45851 Suicidal ideations: Secondary | ICD-10-CM | POA: Diagnosis not present

## 2024-02-04 DIAGNOSIS — F430 Acute stress reaction: Secondary | ICD-10-CM

## 2024-02-04 LAB — COMPREHENSIVE METABOLIC PANEL WITH GFR
ALT: 19 U/L (ref 0–44)
AST: 19 U/L (ref 15–41)
Albumin: 4.6 g/dL (ref 3.5–5.0)
Alkaline Phosphatase: 64 U/L (ref 38–126)
Anion gap: 13 (ref 5–15)
BUN: 12 mg/dL (ref 6–20)
CO2: 24 mmol/L (ref 22–32)
Calcium: 9.5 mg/dL (ref 8.9–10.3)
Chloride: 105 mmol/L (ref 98–111)
Creatinine, Ser: 0.98 mg/dL (ref 0.44–1.00)
GFR, Estimated: >60 mL/min (ref >60)
Glucose, Bld: 107 mg/dL — ABNORMAL HIGH (ref 70–99)
Potassium: 4 mmol/L (ref 3.5–5.1)
Sodium: 141 mmol/L (ref 135–145)
Total Bilirubin: 0.3 mg/dL (ref 0.0–1.2)
Total Protein: 7.8 g/dL (ref 6.5–8.1)

## 2024-02-04 LAB — CBC WITH DIFFERENTIAL/PLATELET
Abs Immature Granulocytes: 0.03 K/uL (ref 0.00–0.07)
Basophils Absolute: 0.1 K/uL (ref 0.0–0.1)
Basophils Relative: 1 %
Eosinophils Absolute: 0.1 K/uL (ref 0.0–0.5)
Eosinophils Relative: 1 %
HCT: 40.5 % (ref 36.0–46.0)
Hemoglobin: 14 g/dL (ref 12.0–15.0)
Immature Granulocytes: 0 %
Lymphocytes Relative: 22 %
Lymphs Abs: 2.2 K/uL (ref 0.7–4.0)
MCH: 30.8 pg (ref 26.0–34.0)
MCHC: 34.6 g/dL (ref 30.0–36.0)
MCV: 89 fL (ref 80.0–100.0)
Monocytes Absolute: 0.7 K/uL (ref 0.1–1.0)
Monocytes Relative: 7 %
Neutro Abs: 6.9 K/uL (ref 1.7–7.7)
Neutrophils Relative %: 69 %
Platelets: 263 K/uL (ref 150–400)
RBC: 4.55 MIL/uL (ref 3.87–5.11)
RDW: 13.2 % (ref 11.5–15.5)
WBC: 10 K/uL (ref 4.0–10.5)
nRBC: 0 % (ref 0.0–0.2)

## 2024-02-04 LAB — ETHANOL: Alcohol, Ethyl (B): 78 mg/dL — ABNORMAL HIGH (ref <15)

## 2024-02-04 LAB — SALICYLATE LEVEL: Salicylate Lvl: <7 mg/dL — ABNORMAL LOW (ref 7.0–30.0)

## 2024-02-04 LAB — ACETAMINOPHEN LEVEL: Acetaminophen (Tylenol), Serum: <10 ug/mL — ABNORMAL LOW (ref 10–30)

## 2024-02-04 MED ORDER — ARIPIPRAZOLE 10 MG PO TABS
10.0000 mg | ORAL_TABLET | Freq: Every day | ORAL | Status: DC
Start: 2024-02-04 — End: 2024-02-04
  Administered 2024-02-04: 10 mg via ORAL
  Filled 2024-02-04: qty 1

## 2024-02-04 MED ORDER — ESCITALOPRAM OXALATE 10 MG PO TABS
20.0000 mg | ORAL_TABLET | Freq: Every day | ORAL | Status: DC
Start: 2024-02-04 — End: 2024-02-04
  Administered 2024-02-04: 20 mg via ORAL
  Filled 2024-02-04: qty 2

## 2024-02-04 NOTE — ED Notes (Signed)
 Pt given phone to call Aunt to pick her up. Pt informed to let Aunt know to come into lobby and let us  know she is here.

## 2024-02-04 NOTE — ED Notes (Signed)
 Pt returned phone stating did not get to speak with anyone, VM left for callback.

## 2024-02-04 NOTE — ED Notes (Signed)
 Pt requesting to use phone again. Pt provided with phone by EDT Niels.

## 2024-02-04 NOTE — ED Notes (Signed)
 Pt provided with belongings and currently changing in restroom.

## 2024-02-04 NOTE — ED Notes (Signed)
 Pt asking to use phone. Pt given phone by security.
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
## 2024-02-04 NOTE — ED Notes (Signed)
 Pt provided with cell phone, paper and crayon to obtain numbers.

## 2024-02-04 NOTE — ED Notes (Signed)
 Attempted x2 to draw repeat CBC in left AC unsuccessfully. Dressing applied with no active bleeding noted. Patient tolerated well.

## 2024-02-04 NOTE — ED Notes (Signed)
 VOL/ Pending psych consult/assessment

## 2024-02-04 NOTE — Discharge Instructions (Signed)
 Your CT scans today showed no acute traumatic injury but did show an opacified, expanded left sphenoid sinus and posterior left ethmoid air cells that has been stable since previous imaging on 03/24/2023.  You will need to follow-up with ear nose and throat doctor for this.  Their number has been provided to you.

## 2024-02-04 NOTE — ED Provider Notes (Signed)
 Patient seen by psychiatry and cleared for discharge   Arlander Charleston, MD 02/04/24 1118

## 2024-02-04 NOTE — ED Notes (Signed)
 Pt given breakfast tray

## 2024-02-04 NOTE — ED Notes (Signed)
 Repeat CBC obtained successfully via IV U/S guided technique by ED RN Glendale Knights, patient tolerated well. Dressing in place with no active shadowing noted on dressing.

## 2024-02-27 ENCOUNTER — Other Ambulatory Visit: Payer: Self-pay

## 2024-02-27 ENCOUNTER — Emergency Department
Admission: EM | Admit: 2024-02-27 | Discharge: 2024-02-27 | Disposition: A | Discharge status: Home / Self Care | Attending: Emergency Medicine | Admitting: Emergency Medicine

## 2024-02-27 DIAGNOSIS — T192XXA Foreign body in vulva and vagina, initial encounter: Secondary | ICD-10-CM | POA: Insufficient documentation

## 2024-02-27 DIAGNOSIS — X58XXXA Exposure to other specified factors, initial encounter: Secondary | ICD-10-CM | POA: Insufficient documentation

## 2024-02-27 DIAGNOSIS — J45909 Unspecified asthma, uncomplicated: Secondary | ICD-10-CM | POA: Insufficient documentation
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
## 2024-02-27 NOTE — ED Triage Notes (Signed)
 Pt reports concern for possible tampon stuck. Sts she inserted a tampon approx 8 hrs ago does not recall removing it. No pain. No discomfort. No discharge.

## 2024-02-27 NOTE — Discharge Instructions (Signed)
 Your tampon was removed.  Please return for any new, worsening, or changing symptoms or other concerns.  It was a pleasure caring for you today.

## 2024-04-05 ENCOUNTER — Ambulatory Visit: Admission: EM | Admit: 2024-04-05 | Discharge: 2024-04-05 | Disposition: A | Discharge status: Home / Self Care

## 2024-04-05 DIAGNOSIS — T24211A Burn of second degree of right thigh, initial encounter: Secondary | ICD-10-CM

## 2024-04-05 DIAGNOSIS — T23222A Burn of second degree of single left finger (nail) except thumb, initial encounter: Secondary | ICD-10-CM

## 2024-04-05 MED ORDER — SILVER SULFADIAZINE 1 % EX CREA: 1.0000 | TOPICAL_CREAM | Freq: Every day | CUTANEOUS | 1 refills | Status: AC

## 2024-04-05 MED ORDER — SILVER SULFADIAZINE 1 % EX CREA: TOPICAL_CREAM | Freq: Once | CUTANEOUS | Status: AC

## 2024-04-05 NOTE — Discharge Instructions (Addendum)
 Apply the Silvadene  cream as directed.  Follow-up with your primary care provider tomorrow.  Go to the emergency department if you have worsening symptoms.
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
## 2024-04-05 NOTE — ED Provider Notes (Signed)
 " CAY RALPH PELT    CSN: [id number] Arrival date & time: 04/05/24  1311      History   Chief Complaint Chief Complaint  Patient presents with   Burn    HPI ALIRA FRETWELL is a 24 y.o. female.  Patient presents with burns on her right thigh and left index finger that occurred 2 days ago when she spilled hot soup.  She has been treating the burns with Neosporin and aloe.  She denies fever, chills, purulent drainage.  The history is provided by the patient and medical records.    Past Medical History:  Diagnosis Date   Acne    Anxiety    Asthma    Depression    Eating disorder, unspecified    Bulimia   Eczema    GERD (gastroesophageal reflux disease)    Headache    sinus related    Patient Active Problem List   Diagnosis Date Noted   Acute stress reaction 02/04/2024   Suicidal ideation 02/04/2024   MDD (major depressive disorder), recurrent episode, severe (HCC) 09/14/2022   Bulimia nervosa (HCC) 03/28/2022   MDD (major depressive disorder), recurrent severe, without psychosis (HCC) 03/25/2022   Generalized anxiety disorder 03/25/2022   Panic disorder 03/25/2022   MDD (major depressive disorder) 03/24/2022   Intentional overdose (HCC) 03/23/2022   Attention deficit hyperactivity disorder (ADHD) 07/24/2019   Allergic rhinitis due to allergen 07/24/2019   Overweight 07/24/2019   Intractable migraine without aura and without status migrainosus 05/09/2018   Acne 07/19/2016   Asthma 07/19/2016   Anxiety and depression 07/19/2016    Past Surgical History:  Procedure Laterality Date   ENDOSCOPIC CONCHA BULLOSA RESECTION Left 02/24/2018   Procedure: ENDOSCOPIC CONCHA BULLOSA REDUCTION VIA NASAL ENDOSCOPY;  Surgeon: Edda Mt, MD;  Location: [facility];  Service: ENT;  Laterality: Left;   RHINOPLASTY  02/24/2018   septoplasty    SEPTOPLASTY Bilateral 02/24/2018   Procedure: SEPTOPLASTY;  Surgeon: Edda Mt, MD;  Location: [facility];   Service: ENT;  Laterality: Bilateral;   SEPTOPLASTY Bilateral    TONSILLECTOMY     TONSILLECTOMY AND ADENOIDECTOMY N/A 05/27/2017   Procedure: TONSILLECTOMY AND ADENOIDECTOMY;  Surgeon: Edda Mt, MD;  Location: [facility];  Service: ENT;  Laterality: N/A;   WISDOM TOOTH EXTRACTION  10/22/2017   four    OB History     Gravida  0   Para  0   Term  0   Preterm  0   AB  0   Living  0      SAB  0   IAB  0   Ectopic  0   Multiple  0   Live Births  0            Home Medications    Prior to Admission medications  Medication Sig Start Date End Date Taking? Authorizing Provider  ARIPiprazole  (ABILIFY ) 10 MG tablet Take 10 mg by mouth daily. 02/01/24  Yes [provider]  buPROPion (WELLBUTRIN XL) 150 MG 24 hr tablet Take 300 mg by mouth daily. 04/04/24  Yes [provider]  silver  sulfADIAZINE  (SILVADENE ) 1 % cream Apply 1 Application topically daily. 04/05/24  Yes Corlis Burnard DEL, NP  albuterol  (VENTOLIN  HFA) 108 (90 Base) MCG/ACT inhaler Inhale 2 puffs into the lungs every 4 (four) hours as needed for wheezing or shortness of breath.    [provider]  amphetamine -dextroamphetamine  (ADDERALL XR) 15 MG 24 hr capsule Take 15 mg  by mouth daily. 06/30/23   [provider]  escitalopram  (LEXAPRO ) 20 MG tablet Take 20 mg by mouth daily. Patient not taking: Reported on 04/05/2024 09/29/21   [provider]  etonogestrel -ethinyl estradiol  (NUVARING) 0.12-0.015 MG/24HR vaginal ring INSERT 1 RING IN THE VAGINA AND LEAVE IN PLACE FOR 3 CONSECUTIVE WEEKS, THEN REMOVE FOR 1 WEEK AS DIRECTED. 11/08/18   Rilla Barnacle, CNM  MAGNESIUM  PO Take 1 tablet by mouth at bedtime.    [provider]  Melatonin 10 MG TABS Take 10 mg by mouth at bedtime as needed.    [provider]  triamcinolone (NASACORT) 55 MCG/ACT AERO nasal inhaler Place into the nose.  10/03/18  [provider]    Family History Family  History  Problem Relation Age of Onset   Breast cancer Maternal Grandmother 53   Hypertension Maternal Grandmother    Heart disease Maternal Grandmother    Stroke Maternal Grandmother 24   Depression Mother    Lung cancer Maternal Uncle    Drug abuse Father     Social History Social History[1]   Allergies   Grapeseed extract [nutritional supplements], Egg protein-containing drug products, Other, and Pollen extract   Review of Systems Review of Systems  Constitutional:  Negative for chills and fever.  Musculoskeletal:  Negative for arthralgias, gait problem and joint swelling.  Skin:  Positive for color change and wound.  Neurological:  Negative for weakness and numbness.     Physical Exam Triage Vital Signs ED Triage Vitals  Encounter Vitals Group     BP 04/05/24 1352 (!) 142/82     Girls Systolic BP Percentile --      Girls Diastolic BP Percentile --      Boys Systolic BP Percentile --      Boys Diastolic BP Percentile --      Pulse Rate 04/05/24 1352 78     Resp 04/05/24 1352 18     Temp 04/05/24 1352 97.7 F (36.5 C)     Temp src --      SpO2 04/05/24 1352 98 %     Weight --      Height --      Head Circumference --      Peak Flow --      Pain Score 04/05/24 1402 8     Pain Loc --      Pain Education --      Exclude from Growth Chart --    No data found.  Updated Vital Signs BP (!) 142/82   Pulse 78   Temp 97.7 F (36.5 C)   Resp 18   LMP 03/27/2024 (Approximate)   SpO2 98%   Visual Acuity Right Eye Distance:   Left Eye Distance:   Bilateral Distance:    Right Eye Near:   Left Eye Near:    Bilateral Near:     Physical Exam Constitutional:      General: She is not in acute distress. HENT:     Mouth/Throat:     Mouth: Mucous membranes are moist.  Cardiovascular:     Rate and Rhythm: Normal rate.  Pulmonary:     Effort: Pulmonary effort is normal. No respiratory distress.  Skin:    General: Skin is warm and dry.     Findings: Lesion  present.     Comments: Partial-thickness burns on right anterior thigh.  Blister on left index finger.  See pictures.  Neurological:     Mental Status: She is alert.  UC Treatments / Results  Labs (all labs ordered are listed, but only abnormal results are displayed) Labs Reviewed - No data to display  EKG   Radiology No results found.  Procedures Procedures (including critical care time)  Medications Ordered in UC Medications  silver  sulfADIAZINE  (SILVADENE ) 1 % cream (has no administration in time range)    Initial Impression / Assessment and Plan / UC Course  I have reviewed the triage vital signs and the nursing notes.  Pertinent labs & imaging results that were available during my care of the patient were reviewed by me and considered in my medical decision making (see chart for details).    Partial-thickness burn of right thigh, partial-thickness burn of left index finger.  Patient's burns occurred 2 days ago when she accidentally spilled hot soup.  Treating today with Silvadene .  Education provided on burn care.  Instructed patient to follow-up with her PCP tomorrow.  ED precautions given.  She agrees to plan of care.   Final Clinical Impressions(s) / UC Diagnoses   Final diagnoses:  Partial thickness burn of right thigh, initial encounter  Partial thickness burn of left index finger     Discharge Instructions      Apply the Silvadene  cream as directed.  Follow-up with your primary care provider tomorrow.  Go to the emergency department if you have worsening symptoms.     ED Prescriptions     Medication Sig Dispense Auth. Provider   silver  sulfADIAZINE  (SILVADENE ) 1 % cream Apply 1 Application topically daily. 50 g Corlis Burnard DEL, NP      PDMP not reviewed this encounter.    [1]  Social History Tobacco Use   Smoking status: Former    Types: Cigarettes   Smokeless tobacco: Former  Building Services Engineer status: Never Used  Substance  Use Topics   Alcohol use: Yes    Comment: social   Drug use: Yes    Types: Marijuana     Corlis Burnard DEL, NP 04/05/24 1443  "

## 2024-04-26 ENCOUNTER — Ambulatory Visit: Admitting: Family Medicine
